# Patient Record
Sex: Female | Born: 1980
Health system: Southern US, Community
[De-identification: ages and names within clinical notes are randomized; demographics above are authoritative.]

## PROBLEM LIST (undated history)

## (undated) DIAGNOSIS — N390 Urinary tract infection, site not specified: Secondary | ICD-10-CM

## (undated) DIAGNOSIS — N6009 Solitary cyst of unspecified breast: Secondary | ICD-10-CM

## (undated) DIAGNOSIS — G43909 Migraine, unspecified, not intractable, without status migrainosus: Secondary | ICD-10-CM

## (undated) DIAGNOSIS — R51 Headache: Secondary | ICD-10-CM

## (undated) DIAGNOSIS — L700 Acne vulgaris: Secondary | ICD-10-CM

## (undated) DIAGNOSIS — G500 Trigeminal neuralgia: Secondary | ICD-10-CM

## (undated) HISTORY — DX: Trigeminal neuralgia: G50.0

## (undated) HISTORY — DX: Headache: R51

## (undated) HISTORY — DX: Acne vulgaris: L70.0

## (undated) HISTORY — DX: Migraine, unspecified, not intractable, without status migrainosus: G43.909

## (undated) HISTORY — PX: NO PAST SURGERIES: SHX2092

## (undated) HISTORY — DX: Urinary tract infection, site not specified: N39.0

## (undated) HISTORY — DX: Solitary cyst of unspecified breast: N60.09

---

## 2009-12-30 DIAGNOSIS — N6009 Solitary cyst of unspecified breast: Secondary | ICD-10-CM

## 2009-12-30 HISTORY — DX: Solitary cyst of unspecified breast: N60.09

## 2014-12-30 DIAGNOSIS — G8929 Other chronic pain: Secondary | ICD-10-CM

## 2014-12-30 DIAGNOSIS — L700 Acne vulgaris: Secondary | ICD-10-CM

## 2014-12-30 DIAGNOSIS — R519 Headache, unspecified: Secondary | ICD-10-CM

## 2014-12-30 HISTORY — DX: Acne vulgaris: L70.0

## 2014-12-30 HISTORY — DX: Headache, unspecified: R51.9

## 2014-12-30 HISTORY — DX: Other chronic pain: G89.29

## 2015-12-31 DIAGNOSIS — G500 Trigeminal neuralgia: Secondary | ICD-10-CM

## 2015-12-31 HISTORY — DX: Trigeminal neuralgia: G50.0

## 2016-07-12 ENCOUNTER — Other Ambulatory Visit (HOSPITAL_COMMUNITY): Payer: Self-pay | Admitting: *Deleted

## 2016-07-12 ENCOUNTER — Ambulatory Visit (INDEPENDENT_AMBULATORY_CARE_PROVIDER_SITE_OTHER): Payer: Self-pay | Admitting: Internal Medicine

## 2016-07-12 ENCOUNTER — Encounter: Payer: Self-pay | Admitting: Internal Medicine

## 2016-07-12 VITALS — BP 100/60 | HR 61 | Temp 98.2°F | Resp 16 | Ht 58.5 in | Wt 116.0 lb

## 2016-07-12 DIAGNOSIS — N6320 Unspecified lump in the left breast, unspecified quadrant: Secondary | ICD-10-CM

## 2016-07-12 DIAGNOSIS — A499 Bacterial infection, unspecified: Secondary | ICD-10-CM

## 2016-07-12 DIAGNOSIS — B9689 Other specified bacterial agents as the cause of diseases classified elsewhere: Secondary | ICD-10-CM

## 2016-07-12 DIAGNOSIS — N644 Mastodynia: Secondary | ICD-10-CM

## 2016-07-12 DIAGNOSIS — R300 Dysuria: Secondary | ICD-10-CM

## 2016-07-12 DIAGNOSIS — N760 Acute vaginitis: Secondary | ICD-10-CM

## 2016-07-12 LAB — POCT URINALYSIS DIPSTICK
BILIRUBIN UA: NEGATIVE
Glucose, UA: NEGATIVE
NITRITE UA: NEGATIVE
PH UA: 5
Protein, UA: NEGATIVE
Spec Grav, UA: 1.02
Urobilinogen, UA: 0.2

## 2016-07-12 LAB — POCT WET PREP WITH KOH
KOH PREP POC: NEGATIVE
Trichomonas, UA: NEGATIVE
YEAST WET PREP PER HPF POC: NEGATIVE

## 2016-07-12 MED ORDER — METRONIDAZOLE 500 MG PO TABS: ORAL_TABLET | ORAL | Status: DC

## 2016-07-12 NOTE — Progress Notes (Signed)
Subjective:    Patient ID: Tabitha [name], female    DOB: [birth year], 34 y.o.   MRN: [id number]  HPI   New patient here to establish  Concerns:  1.  Left Breast pain:  Had similar pain about 6 years ago. Was told she had a cyst then.  Stopped taking BCPs at the time and resolved.   Recurred about 2 months.  Pain is now constant since starting this go around.  Sore, throbbing pain, sometimes pinching or needle like pain.  No nipple discharge.  No axillary tenderness.  No redness or swelling of breast.    Does feel a little lump that is tender in the area of pain.  No hormonal birth control at this time.   Periods have been regular.   LMP June 19th--normal G1SAB1 Miscarried in first trimester. Taking Evening Primrose for the breast discomfort    2.  Vaginal burning and itching for 1 week.  Burning worse with urination--feels like it is also on the inside.  + urinary frequency.  Was taking OTC ? Azo for 4 days without improvement.  Last dose yesterday.  No fever, possibly some left flank pain.  No hematuria.  No nausea or vomiting.  Drinking fluids well. No antibiotic use recently.  Has this frequently, but always told her urine is ok.      Current outpatient prescriptions:  .  ibuprofen (ADVIL,MOTRIN) 200 MG tablet, Take 200 mg by mouth every 6 (six) hours as needed. 2 tabs every 6 hours as needed for breast pain, Disp: , Rfl:    No Known Allergies   Past Medical History  Diagnosis Date  . Breast cyst 2011    [place], [place]   No past surgical history on file.   No family history on file.   Social History   Social History  . Marital Status: Married    Spouse Name: Franciso Bend  . Number of Children: 0  . Years of Education: N/A   Occupational History  . unemployed    Social History Main Topics  . Smoking status: Former Smoker -- 0.25 packs/day for 7 years    Types: Cigarettes    Quit date: 12/30/2014  . Smokeless tobacco: Never Used     Comment: socially  .  Alcohol Use: 0.0 oz/week    0 Standard drinks or equivalent per week     Comment: socially  . Drug Use: No  . Sexual Activity:    Partners: Male    Birth Control/ Protection: Condom   Other Topics Concern  . Not on file   Social History Narrative   Originally from [place]   Came to [place] [employer] [place]. In 2010   Lived in [place] since 2.2017   Lives with her boyfriend       Review of Systems     Objective:   Physical Exam NAD HEENT:  PERRL, EOMI,  Neck:  Supple, No adenopathy Chest:  CTA Breasts:  No discreet focal mass, but generalized lumpiness in upper outer quadrant ob bilateral breasts, both areas of which are tender L>R.  No skin dimpling, nipple discharge or axillary adenopathy CV:  RRR with normal S1 and S2, No S3, S4 or murmur.  Radial pulses normal and equal Abd:  S, NT, No HSM or mass, + BS throughout. GU:  Normal external genitalia, mild white discharge, No vaginal or cervical inflammation. No CMT, No uterine or adnexal mass or tenderness.   Results for LOUNA, ROTHGEB (MRN [id number]) as of  07/17/2016 08:30  Ref. Range 07/12/2016 10:36  Bilirubin, UA Unknown negative  Clarity, UA Unknown clear  Color, UA Unknown yellow  Glucose Unknown negative  Ketones, UA Unknown 5(+/-)  Leukocytes, UA Latest Ref Range: Negative  small (1+) (A)  Nitrite, UA Unknown negative  pH, UA Unknown 5.0  Protein, UA Unknown negative  RBC, UA Unknown 1+  Specific Gravity, UA Unknown 1.020  Urobilinogen, UA Unknown 0.2      Assessment & Plan:  1.  Bilateral breast tenderness with fibrocystic feel to upper outer quads of breasts bilaterally.  Send for diagnostic mammogram and U/S  2.  Dysuria:  Unlikely UTI, but send for urine culture.  Not clear if any relationship, but does have bacterial vaginosis.  Treat the latter with 7 day course of twice daily Metronidazole 500 mg.  Addendum:  Delay in contacting patient regarding low count Urine culture E. Coli, nitrofurantoin and Macrobid too  expensive.  [facility]  To call if develops yeast infection symptoms (called to pt. By Jenean LindauEstefania)   3.  Vaginal Discharge/burning and itching:  BV treatment as above.  Check GC/Chlamydia as well.

## 2016-07-12 NOTE — Patient Instructions (Signed)
Ibuprofen 200 mg pastillas, 3-4 pastillas dos veces al dia para dolor de pecho

## 2016-07-15 LAB — URINE CULTURE

## 2016-07-18 LAB — GC/CHLAMYDIA PROBE AMP
CHLAMYDIA, DNA PROBE: NEGATIVE
Neisseria gonorrhoeae by PCR: NEGATIVE

## 2016-07-19 MED ORDER — AMOXICILLIN-POT CLAVULANATE 875-125 MG PO TABS: 1.0000 | ORAL_TABLET | Freq: Two times a day (BID) | ORAL | Status: DC

## 2016-08-01 ENCOUNTER — Ambulatory Visit (HOSPITAL_COMMUNITY)
Admission: RE | Admit: 2016-08-01 | Discharge: 2016-08-01 | Disposition: A | Discharge status: Home / Self Care | Payer: Self-pay | Source: Ambulatory Visit | Attending: Obstetrics and Gynecology | Admitting: Obstetrics and Gynecology

## 2016-08-01 ENCOUNTER — Ambulatory Visit
Admission: RE | Admit: 2016-08-01 | Discharge: 2016-08-01 | Disposition: A | Discharge status: Home / Self Care | Payer: No Typology Code available for payment source | Source: Ambulatory Visit | Attending: Obstetrics and Gynecology | Admitting: Obstetrics and Gynecology

## 2016-08-01 ENCOUNTER — Encounter (HOSPITAL_COMMUNITY): Payer: Self-pay

## 2016-08-01 ENCOUNTER — Other Ambulatory Visit (HOSPITAL_COMMUNITY): Payer: Self-pay | Admitting: Obstetrics and Gynecology

## 2016-08-01 VITALS — BP 100/60 | Temp 98.7°F | Ht 58.5 in | Wt 114.0 lb

## 2016-08-01 DIAGNOSIS — N6320 Unspecified lump in the left breast, unspecified quadrant: Secondary | ICD-10-CM

## 2016-08-01 DIAGNOSIS — N644 Mastodynia: Secondary | ICD-10-CM

## 2016-08-01 DIAGNOSIS — N6321 Unspecified lump in the left breast, upper outer quadrant: Secondary | ICD-10-CM

## 2016-08-01 DIAGNOSIS — Z1239 Encounter for other screening for malignant neoplasm of breast: Secondary | ICD-10-CM

## 2016-08-01 NOTE — Patient Instructions (Signed)
Explained self breast awareness to [facility]. Let patient know she did not need a Pap smear today due to last Pap smear was 1 1/2 years ago per patient. Let her know BCCCP will cover Pap smears every 3 years unless has a history of abnormal Pap smears. Referred patient to the [facility] for diagnostic mammogram. Appointment scheduled for Thursday, August 01, 2016 at 1440. Tabitha [name] verbalized understanding.  Malik Ruffino, [name], RN 3:29 PM

## 2016-08-01 NOTE — Progress Notes (Signed)
Complaints of left breast lump x 1 month that is painful. Patient states it is a constant pain that she rates at a 5 out of 10.  Pap Smear:  Pap smear not completed today. Last Pap smear was 1 1/2 years ago in [place] and normal per patient. Per patient has no history of an abnormal Pap smear. No Pap smear results are in EPIC.  Physical exam: Breasts Breasts symmetrical. No skin abnormalities bilateral breasts. No nipple retraction bilateral breasts. No nipple discharge bilateral breasts. No lymphadenopathy. No lumps palpated right breast. Palpated a mobile lump within the left breast at 2 o'clock next to the areola. Complaints of bilateral outer breast tenderness on exam. Referred patient to the [facility] for diagnostic mammogram. Appointment scheduled for Thursday, August 01, 2016 at 1440.        Pelvic/Bimanual No Pap smear completed today since last Pap smear was 1 1/2 years ago per patient. Pap smear not indicated per BCCCP guidelines.   Smoking History: Patient has never smoked.  Patient Navigation: Patient education provided. Access to services provided for patient through [facility] program. Spanish interpreter provided.    Used Spanish interpreter Darletta Moll from [employer].

## 2016-08-05 ENCOUNTER — Encounter (HOSPITAL_COMMUNITY): Payer: Self-pay | Admitting: *Deleted

## 2016-08-09 ENCOUNTER — Other Ambulatory Visit (INDEPENDENT_AMBULATORY_CARE_PROVIDER_SITE_OTHER): Payer: Self-pay | Admitting: Internal Medicine

## 2016-08-09 DIAGNOSIS — R300 Dysuria: Secondary | ICD-10-CM

## 2016-08-09 DIAGNOSIS — N390 Urinary tract infection, site not specified: Secondary | ICD-10-CM

## 2016-08-09 DIAGNOSIS — R319 Hematuria, unspecified: Secondary | ICD-10-CM

## 2016-08-09 LAB — POCT URINALYSIS DIPSTICK
BILIRUBIN UA: NEGATIVE
Glucose, UA: NEGATIVE
KETONES UA: NEGATIVE
Nitrite, UA: POSITIVE
PH UA: 6
Protein, UA: NEGATIVE
SPEC GRAV UA: 1.015
Urobilinogen, UA: 0.2

## 2016-08-09 MED ORDER — NITROFURANTOIN MONOHYD MACRO 100 MG PO CAPS: 100.0000 mg | ORAL_CAPSULE | Freq: Two times a day (BID) | ORAL | 0 refills | Status: AC

## 2016-08-09 NOTE — Progress Notes (Signed)
Patient came in today complaining of dysuria. No physician in the office today. UA positive for nitrites. Sent for culture. Patient informed that we will call her when the results are back from the culture. Macrobid sent in for 10 days.

## 2016-08-09 NOTE — Patient Instructions (Signed)
Urocultivo y antibiograma (Urine Culture and Sensitivity Testing) POR QU ME DEBO REALIZAR ESTE ANLISIS?  El urocultivo es un anlisis que permite determinar si hay proliferacin de grmenes en la [place]. Normalmente, no hay grmenes en la orina (estril). Por lo general, los grmenes presentes en la orina son bacterias. A veces, pueden ser hongos. Estos grmenes pueden causar una infeccin urinaria. Pueden hacerle este anlisis si tiene sntomas de infeccin urinaria. Estos pueden incluir los siguientes:  Ganas frecuentes de Geographical information systems officer.  Ardor al Beatrix Shipper. Si est embarazada, el mdico puede indicar que se haga este anlisis para evaluar si tiene una infeccin urinaria. Cuando es [place], la orina pasa por el conducto a travs del cual se vaca la vejiga (uretra). En los hombres, la orina se evaca a travs de un orificio en la punta del pene. En las mujeres, la eliminacin se produce a travs de un conducto que est justo arriba de la abertura vaginal. Cerca de estas zonas, puede haber bacterias que normalmente viven en la piel (flora normal). QU TIPO DE MUESTRA SE TOMA? Para realizar un urocultivo, se debe recolectar una [place] de orina de modo de evitar que la flora normal llegue a la [place]. El mtodo que se [place] con ms frecuencia se conoce como recoleccin de una muestra estril. En algunos casos, tal vez haya que recolectar la orina directamente de la vejiga por medio de una sonda delgada y flexible (catter). El mdico introduce el catter a travs de Network engineer a la vejiga. La muestra de orina se Scientific laboratory technician en placas que contienen una sustancia que estimula la proliferacin de las bacterias (placas de agar). Estas placas se conservan a temperatura ambiente durante un lapso de 24 a 48horas para comprobar si hay crecimiento de bacterias u otros grmenes. Luego, un tcnico de laboratorio las examina con un microscopio para Landscape architect presencia de grmenes. Los grmenes que  crezcan en el cultivo sern sometidos a pruebas frente a una variedad de medicamentos para determinar cul es el ms eficaz (antibiograma). En el caso de una infeccin urinaria causada por bacterias, se realizarn pruebas con varios tipos de antibiticos. CMO DEBO PREPARARME PARA ESTE ANLISIS?  No debe orinar durante una hora antes de la recoleccin de la [place].  Tome un vaso de agua aproximadamente antes de la recoleccin de la [place].  Informe al mdico si ha estado tomando antibiticos, ya que estos pueden incidir en los [place] del [place]. El mdico puede proporcionarle paos estriles para limpiar la vagina o el pene a fin de prepararse para recolectar una muestra estril. Para tomar la [place], tendr que hacer lo siguiente: Mujeres y nias  Sintese en el inodoro y separe los labios de la vagina.  Con un pao, limpie la zona de la vagina de Camera operator.  Con un segundo pao, limpie el orificio de Engineer, mining.  Evacue una pequea cantidad de orina directamente en el inodoro mientras mantiene separados los labios de la vagina.  Luego, sostenga un recipiente estril por debajo y orine dentro de [place].  Llene el recipiente hasta aproximadamente la mitad de su capacidad. Tpelo y devulvalo para su anlisis. Hombres y nios  Con un pao estril, limpie la punta del pene.  Evacue primero una pequea cantidad de orina directamente en el inodoro.  Luego, orine dentro del recipiente estril.  Llene el recipiente hasta aproximadamente la mitad de su capacidad. Tpelo y devulvalo para su anlisis. QU SIGNIFICAN LOS RESULTADOS? Los resultados del cultivo de [place]  y el antibiograma sern positivos o negativos.   Si crecen bastantes bacterias en la [place]muestra de [place], se considera que el resultado del anlisis es positivo.  Si crecen muchas bacterias diferentes en la [place]muestra de [place], tal vez el informe indique que el anlisis est contaminado.  Si no  crecen bacterias en la muestra despus de 24 a 48horas, se considera que el resultado del anlisis es negativo.  Los Norfolk Southernresultados del antibiograma le permiten al mdico saber qu medicamentos usar para tratar la infeccin. Si los resultados del cultivo de [place]or[place] son Ravennanegativos, significa lo siguiente:  Que es menos probable que tenga una infeccin urinaria.  Que se puede repetir el anlisis si los sntomas continan. Si los resultados del cultivo de [place]orina son positivos, significa lo siguiente:  Que es ms probable que tenga una infeccin urinaria.  Que tal vez deba comenzar un tratamiento en funcin de los [place]resultados del antibiograma. Hable con el mdico Dole Foodsobre los resultados, las opciones de tratamiento y, si es necesario, la necesidad de Education officer, environmentalrealizar ms [place]estudios. Es su responsabilidad retirar el resultado del [place]estudio. Consulte en el laboratorio o en el departamento en el que fue realizado el estudio cundo y cmo podr Starbucks Corporationobtener los resultados. Hable con el mdico si tiene Dynegyalguna pregunta sobre los resultados.   Esta informacin no tiene Theme park managercomo fin reemplazar el consejo del mdico. Asegrese de hacerle al mdico cualquier pregunta que tenga.   Document Released: 10/06/2013 Document Revised: 01/06/2015 Elsevier Interactive Patient Education [employer]2016 Elsevier Inc.

## 2016-08-12 LAB — URINE CULTURE

## 2016-08-12 NOTE — Progress Notes (Signed)
Pt. Informed and scheduled to come in for repeat UA in 10 days

## 2016-08-23 ENCOUNTER — Other Ambulatory Visit (INDEPENDENT_AMBULATORY_CARE_PROVIDER_SITE_OTHER): Payer: Self-pay

## 2016-08-23 ENCOUNTER — Ambulatory Visit (INDEPENDENT_AMBULATORY_CARE_PROVIDER_SITE_OTHER): Payer: Self-pay | Admitting: Internal Medicine

## 2016-08-23 ENCOUNTER — Encounter: Payer: Self-pay | Admitting: Internal Medicine

## 2016-08-23 VITALS — BP 98/60 | HR 66 | Temp 98.2°F | Resp 16 | Ht <= 58 in | Wt 108.0 lb

## 2016-08-23 DIAGNOSIS — R300 Dysuria: Secondary | ICD-10-CM

## 2016-08-23 DIAGNOSIS — K0889 Other specified disorders of teeth and supporting structures: Secondary | ICD-10-CM

## 2016-08-23 LAB — POCT URINALYSIS DIPSTICK
BILIRUBIN UA: NEGATIVE
Glucose, UA: NEGATIVE
Leukocytes, UA: NEGATIVE
Nitrite, UA: NEGATIVE
PH UA: 7
PROTEIN UA: NEGATIVE
Spec Grav, UA: 1.015
Urobilinogen, UA: 0.2

## 2016-08-23 MED ORDER — NAPROXEN 500 MG PO TABS: 500.0000 mg | ORAL_TABLET | Freq: Two times a day (BID) | ORAL | 1 refills | Status: DC

## 2016-08-23 MED ORDER — HYDROCODONE-ACETAMINOPHEN 5-325 MG PO TABS: ORAL_TABLET | ORAL | 0 refills | Status: DC

## 2016-08-23 NOTE — Progress Notes (Signed)
   Subjective:    Patient ID: Tabitha [name], female    DOB: 05/23/1981, 35 y.o.   MRN: [id number]  HPI   1.  Dental pain:  Has had pain in right lower molar for a few days. Pain with eating or with air on her tooth--electric type pain.  No inflammation/swelling or redness of gum.   No fever.   Brushes twice daily and flosses occasionally. Tried 600 mg of Ibuprofen and was not effective.  2.  UTI:  Grew low count e coli sensitive to Augmentin in July.  Treated with Augmentin.  Did not improve and recultured with return of definitive growth of e coli now resistant to Augmentin.  Treated with 10 days of Macrobid.  States a bit better, but still with dysuria every time she urinates. UA shows mild microscopic blood, but leukocytes have cleared this time. No intercouse since started with symptoms  Current Meds  Medication Sig  . [DISCONTINUED] ibuprofen (ADVIL,MOTRIN) 200 MG tablet Take 200 mg by mouth every 6 (six) hours as needed. 2 tabs every 6 hours as needed for breast pain   No Known Allergies    Review of Systems     Objective:   Physical Exam NAD HEENT:  Darkened area of posterior molar, right lower jaw.  No surrounding erythema or swelling.  "electric shocks"  When tap tooth.   Neck:  No adenopathy, supple Chest:  CTA CV:  RRR without murmur or rub No flank tenderness        Assessment & Plan:  1.  Dental Pain:  Try Naproxen twice daily with food for much of pain control and Hydrocodone when more severe.  Dental referral--urgent as not eating.  2.  Dysuria with microscopic hematuria.  Symptoms continue.  Recheck urine culture.  May need to come in for repeated Ceftriaxone IM dosing if is resistant to [facility]

## 2016-08-26 LAB — URINE CULTURE

## 2016-08-27 ENCOUNTER — Other Ambulatory Visit (INDEPENDENT_AMBULATORY_CARE_PROVIDER_SITE_OTHER): Payer: Self-pay

## 2016-08-27 DIAGNOSIS — N3000 Acute cystitis without hematuria: Secondary | ICD-10-CM

## 2016-08-27 MED ORDER — CEFTRIAXONE SODIUM 1 G IJ SOLR
1.0000 g | Freq: Once | INTRAMUSCULAR | Status: AC
  Administered 2016-08-27: 1 g via INTRAMUSCULAR

## 2016-08-27 NOTE — Progress Notes (Signed)
Patient came in for first 1 gram injection of Rocephin. Patient given 1ml of ceftriaxone in left glute. Patient tolerated well. LOT [id number] EXP 02/27/2018 NDC [id number] Diluted with lidocaine 1% LOT [id number] EXP 02/27/2017 NDC [id number]  Patient spoke with Dr. Delrae AlfredMulberry and stated [facility] but doesn't [facility] the clinic. Patient to come back for another injection tomorrow.

## 2016-08-28 ENCOUNTER — Other Ambulatory Visit (INDEPENDENT_AMBULATORY_CARE_PROVIDER_SITE_OTHER): Payer: Self-pay

## 2016-08-28 DIAGNOSIS — N3000 Acute cystitis without hematuria: Secondary | ICD-10-CM

## 2016-08-28 MED ORDER — CEFTRIAXONE SODIUM 1 G IJ SOLR
1.0000 g | INTRAMUSCULAR | Status: DC
  Administered 2016-08-28 – 2016-08-30 (×3): 1 g via INTRAMUSCULAR

## 2016-08-28 NOTE — Progress Notes (Signed)
Patient given 2nd dose of Rocephin today IM in right glute. Patient tolerated well.

## 2016-08-29 ENCOUNTER — Ambulatory Visit (INDEPENDENT_AMBULATORY_CARE_PROVIDER_SITE_OTHER): Payer: Self-pay

## 2016-08-29 DIAGNOSIS — N3000 Acute cystitis without hematuria: Secondary | ICD-10-CM

## 2016-08-29 NOTE — Progress Notes (Signed)
Patient came in for 3rd injection of rocephin for UTI in left glute. Patient tolerated well. Diluted with 2.601ml Lidocaine Lot [id number] exp 02/27/2017. Patient to receive 4th injection tomorrow and repeat UA.

## 2016-08-30 ENCOUNTER — Ambulatory Visit (INDEPENDENT_AMBULATORY_CARE_PROVIDER_SITE_OTHER): Payer: Self-pay

## 2016-08-30 DIAGNOSIS — N3000 Acute cystitis without hematuria: Secondary | ICD-10-CM

## 2016-08-30 LAB — POCT URINALYSIS DIPSTICK
Bilirubin, UA: NEGATIVE
GLUCOSE UA: NEGATIVE
KETONES UA: NEGATIVE
Nitrite, UA: NEGATIVE
RBC UA: NEGATIVE
SPEC GRAV UA: 1.02
UROBILINOGEN UA: 0.2
pH, UA: 6

## 2016-08-30 NOTE — Progress Notes (Signed)
Patient given 1 g Rocephin in right glute. Patient will return tomorrow for a 5th injection. UA and urine cx sent to [facility] today. Patient tolerated injection well.

## 2016-08-31 ENCOUNTER — Ambulatory Visit (INDEPENDENT_AMBULATORY_CARE_PROVIDER_SITE_OTHER): Payer: Self-pay | Admitting: Internal Medicine

## 2016-08-31 DIAGNOSIS — N3000 Acute cystitis without hematuria: Secondary | ICD-10-CM

## 2016-08-31 LAB — URINALYSIS, COMPLETE
BILIRUBIN UA: NEGATIVE
GLUCOSE, UA: NEGATIVE
KETONES UA: NEGATIVE
Leukocytes, UA: NEGATIVE
NITRITE UA: NEGATIVE
RBC UA: NEGATIVE
Specific Gravity, UA: 1.012 (ref 1.005–1.030)
Urobilinogen, Ur: 0.2 mg/dL (ref 0.2–1.0)
pH, UA: 6.5 (ref 5.0–7.5)

## 2016-08-31 LAB — MICROSCOPIC EXAMINATION
Bacteria, UA: NONE SEEN
CASTS: NONE SEEN /LPF

## 2016-09-01 ENCOUNTER — Ambulatory Visit (INDEPENDENT_AMBULATORY_CARE_PROVIDER_SITE_OTHER): Payer: Self-pay | Admitting: Internal Medicine

## 2016-09-01 DIAGNOSIS — N3000 Acute cystitis without hematuria: Secondary | ICD-10-CM

## 2016-09-01 LAB — URINE CULTURE: ORGANISM ID, BACTERIA: NO GROWTH

## 2016-09-03 ENCOUNTER — Encounter: Payer: Self-pay | Admitting: Internal Medicine

## 2016-09-03 MED ORDER — CEFTRIAXONE SODIUM 1 G IJ SOLR
1.0000 g | INTRAMUSCULAR | Status: DC
  Administered 2016-09-01 – 2016-09-03 (×2): 1 g via INTRAMUSCULAR

## 2016-09-03 NOTE — Progress Notes (Signed)
Here for follow up of UTI E. Coli sensitive to Ceftriaxone.  Follow up urine culture negative for growth and UA was unremarkable from 2 days ago. Pt. Feeling much better. Last dose of Ceftriaxone today.   To call if problems.

## 2016-09-03 NOTE — Progress Notes (Signed)
Here for 5th dose of Ceftriaxone 1 g IM.  Tolerating medication fine.  Discussed her microscopic UA was normal. And am awaiting her culture results from yesterday. She states she is feeling much better.

## 2016-09-26 ENCOUNTER — Telehealth: Payer: Self-pay | Admitting: Internal Medicine

## 2016-09-26 NOTE — Telephone Encounter (Signed)
Patient called stating she has been taking amitriptyline 25 mg tablet to help her sleep and does not have any refills from her previous doctor. Patient would like Dr. Delrae AlfredMulberry to call in refills. Also patient states she was seen at the dentist's office and the pain medication they prescribed her is not helping with the pain. Patient requesting something stronger for pain to be called in by Dr. Delrae AlfredMulberry

## 2016-09-26 NOTE — Telephone Encounter (Signed)
Amitriptyline is not a medicine I would prescribe without discussing with her first.  I was unaware she was taking it. She would have to make an appointment to discuss her sleep issues first.   She will need to ask the dental clinic for her pain medication as they are aware of what her pain control needs should be.

## 2016-10-01 NOTE — Telephone Encounter (Signed)
VM not set up.

## 2016-10-02 NOTE — Telephone Encounter (Signed)
On 10/02/2016 Patient came to office to inquire about Rx amitriptyline 25 mg tablet.  Pat relayed message left by Dr. Delrae AlfredMulberry.  Patient's representative said, he would wait and talk with Estefania about the Rx.  Pat informed him that we could not go against Dr. Renne CriglerMulberry's order.   Patient would need to make an appointment to discuss her sleep issues first.  Patient was not satisfied with response Dennie BiblePat gave her.  Patient will come back after 2:00 p.m. On 10/02/2016.

## 2016-10-02 NOTE — Telephone Encounter (Signed)
Patient scheduled to come in for OV to discuss sleep issues 10/09/16 @ 6:00PM

## 2016-10-09 ENCOUNTER — Encounter: Payer: Self-pay | Admitting: Internal Medicine

## 2016-10-09 ENCOUNTER — Ambulatory Visit (INDEPENDENT_AMBULATORY_CARE_PROVIDER_SITE_OTHER): Payer: Self-pay | Admitting: Internal Medicine

## 2016-10-09 DIAGNOSIS — L709 Acne, unspecified: Secondary | ICD-10-CM

## 2016-10-09 DIAGNOSIS — G4700 Insomnia, unspecified: Secondary | ICD-10-CM | POA: Insufficient documentation

## 2016-10-09 MED ORDER — AMITRIPTYLINE HCL 25 MG PO TABS: 25.0000 mg | ORAL_TABLET | Freq: Every evening | ORAL | 11 refills | Status: DC | PRN

## 2016-10-09 NOTE — Progress Notes (Signed)
   Subjective:    Patient ID: Tabitha [name], female    DOB: 07/28/1981, 35 y.o.   MRN: [id number]  HPI   1.  Insomnia:  Pt. Previously took Amitriptyline for 9 years to sleep.  Has been out of medication for 4 days.   Pt. Seems irritated today.  States this is the only medication that has worked for her for sleep. Discussed how important it is to share her medications fully in the future as they can have interactions with newer medications and possibly cause her significant problems Discussed Amitriptyline can cause urinary retention and am concerned with her recurrent UTIs that are difficult to treat.  She states she had the UTIs years before starting the Amitritypline.  This is the only medication she has found that helps, but is unable Not able to initiate sleep without the medication.  Wakes also 5-6 times per night, develops neck pain and nausea if tries to wean.  Does not want to stop. Cannot remember what the names of what other meds she has taken in the past.  2.  Acne:  Minor acneiform lesions in nasolabial area.  Uses on limited basis at bedtime to acne lesions.  Not clear where she was getting this as well.  Current Meds  Medication Sig  . amitriptyline (ELAVIL) 25 MG tablet Take 1 tablet (25 mg total) by mouth at bedtime as needed for sleep.  Marland Kitchen. tretinoin (RETIN-A) 0.05 % cream Apply topically at bedtime.  . [DISCONTINUED] amitriptyline (ELAVIL) 25 MG tablet Take 25 mg by mouth at bedtime as needed for sleep.   No Known Allergies    Review of Systems     Objective:   Physical Exam  Patient irritated with discussion today.  4-5 tiny acneiform lesions at nasolabial fold--small red bumps.      Assessment & Plan:  1.  Insomnia:  Have filled the Amitriptyline 25 mg   2.  Acne, mild:  To call when she needs a refill of her Retin A.  Long discussion to stop before she ever considers getting pregnant.  To use sunscreen when using.

## 2016-11-11 ENCOUNTER — Ambulatory Visit: Payer: No Typology Code available for payment source | Admitting: Neurology

## 2016-11-13 ENCOUNTER — Ambulatory Visit: Payer: No Typology Code available for payment source | Admitting: Diagnostic Neuroimaging

## 2017-01-08 ENCOUNTER — Ambulatory Visit: Payer: No Typology Code available for payment source | Admitting: Diagnostic Neuroimaging

## 2017-01-09 ENCOUNTER — Encounter: Payer: Self-pay | Admitting: Diagnostic Neuroimaging

## 2017-01-10 ENCOUNTER — Encounter: Payer: Self-pay | Admitting: Neurology

## 2017-01-10 ENCOUNTER — Ambulatory Visit (INDEPENDENT_AMBULATORY_CARE_PROVIDER_SITE_OTHER): Payer: Self-pay | Admitting: Neurology

## 2017-01-10 DIAGNOSIS — G500 Trigeminal neuralgia: Secondary | ICD-10-CM | POA: Insufficient documentation

## 2017-01-10 HISTORY — DX: Trigeminal neuralgia: G50.0

## 2017-01-10 MED ORDER — CARBAMAZEPINE 200 MG PO TABS: 200.0000 mg | ORAL_TABLET | Freq: Three times a day (TID) | ORAL | 3 refills | Status: DC

## 2017-01-10 NOTE — Patient Instructions (Addendum)
Neuralgia del trigmino (Trigeminal Neuralgia) La neuralgia del trigmino es un trastorno nervioso que causa crisis de dolor facial intenso. Las crisis pueden durar unos segundos o varios minutos. Pueden ocurrir [address] Drivedurante das, [address] Circlesemanas o Happymeses, y Clinical biochemistluego desaparecer durante meses o aos. A la neuralgia del trigmino tambin se la conoce como tic doloroso. CAUSAS La causa de esta afeccin es el dao a un nervio del rostro llamado trigmino. Las siguientes acciones pueden desencadenar una crisis:  Hablar.  Masticar.  Maquillarse.  Lavarse el rostro.  Afeitarse el rostro.  Cepillarse los dientes.  Tocarse el rostro. FACTORES DE RIESGO Es ms probable que esta afeccin se manifieste en:  Las mujeres.  Las Smith Internationalpersonas mayores de [id number]. SNTOMAS El sntoma principal de esta afeccin es el dolor en la [place] Harborcreekmandbula, los labios, los ojos, la [place] Clubnariz, el cuero cabelludo, la frente y Recruitment consultantel rostro. El dolor puede ser intenso, punzante o similar a un choque elctrico. DIAGNSTICO Esta afeccin se diagnostica mediante un examen fsico. Se puede realizar una tomografa computarizada (TC) o una resonancia magntica (RM) para Engineer, productiondescartar la presencia de otros trastornos que pueden causar Armed forces technical officerdolor facial. TRATAMIENTO El tratamiento de esta afeccin puede incluir lo siguiente:  Automotive engineervitar las cosas que desencadenan las crisis.  Analgsicos.  Ciruga. Esta puede realizarse Circuit Citycuando los casos son graves, si otro tratamiento mdico no brinda alivio. INSTRUCCIONES PARA EL CUIDADO EN EL HOGAR  Tome los medicamentos de venta libre y los recetados solamente como se lo haya indicado el mdico.  Si desea quedar embarazada, hable con el mdico antes de intentarlo.  Evite los factores desencadenantes de las crisis. Puede ser de ayuda:  Masticar del lado de la boca que no est afectado.  Evitar tocarse el rostro.  Evitar las rfagas de aire fro o caliente. SOLICITE ATENCIN MDICA SI:  El medicamento no Hotel managerle calma el  dolor.  Le aparecen sntomas nuevos que no se pueden explicar, por ejemplo:  Visin doble.  Debilidad facial.  Cambios en la audicin o el equilibrio.  Ardelle AntonQueda embarazada. SOLICITE ATENCIN MDICA DE INMEDIATO SI:  El dolor es insoportable, y el analgsico no lo [place]alivia. Esta informacin no tiene Theme park managercomo fin reemplazar el consejo del mdico. Asegrese de hacerle al mdico cualquier pregunta que tenga. Document Released: 09/25/2005 Document Revised: 09/06/2015 Document Reviewed: 04/10/2015 Elsevier Interactive Patient Education  2017 [employer]vin[employer]vier Inc.

## 2017-01-10 NOTE — Progress Notes (Signed)
Reason for visit: Trigeminal neuralgia  Referring physician: Dr. Jama Flavors Tabitha [name] is a 36 y.o. female  History of present illness:  Tabitha [name] is a 36 year old right-handed Hispanic female who is non-English-speaking. The patient comes in today with an interpreter. The patient developed right-sided lower jaw discomfort that began about 5 months ago. Initially she went to her dentist, but eventually she followed up with Dr. Purnell Shoemaker, and she was referred to this office. The patient describes an electric shock sensation that goes into her lower face, this can be exacerbated by talking or chewing, or opening her mouth wide. Touching the right face may bring on pain as well. The patient denies any overt numbness of the face, she denies numbness or weakness of the extremities. She denies any neck pain or visual complaints. The patient has not had any balance changes or difficulty controlling the bowels or the bladder. The patient recently was placed on carbamazepine 3 weeks ago taking 100 mg twice daily. The patient does get some pain benefit from this, but she does have breakthrough pain and occasionally she will double up on the medication. She comes in today for an evaluation.  Past Medical History:  Diagnosis Date  . Breast cyst 2011   [place], [place]  . Chronic headaches 2016   Listed as tension and migranous at different times in old records.  Treated with Amitriptyline  . Cystic acne 2016   Treated with Retin A 0.05% cream in past  . Migraine   . Recurrent UTI 2012 to present   Often difficult to resolve  . Trigeminal neuralgia of right side of face 01/10/2017   Right V3 distribution    Past Surgical History:  Procedure Laterality Date  . NO PAST SURGERIES      History reviewed. No pertinent family history.  Social history:  reports that she has been smoking Cigarettes.  She has a 1.75 pack-year smoking history. She has never used smokeless tobacco. She reports that  she drinks alcohol. She reports that she does not use drugs.  Medications:  Prior to Admission medications   Medication Sig Start Date End Date Taking? Authorizing Provider  amitriptyline (ELAVIL) 25 MG tablet Take 1 tablet (25 mg total) by mouth at bedtime as needed for sleep. 10/09/16  Yes Julieanne Manson, MD  tretinoin (RETIN-A) 0.05 % cream Apply topically at bedtime.   Yes Historical Provider, MD  carbamazepine (TEGRETOL) 200 MG tablet Take 1 tablet (200 mg total) by mouth 3 (three) times daily. 01/10/17   York Spaniel, MD     No Known Allergies  ROS:  Out of a complete 14 system review of symptoms, the patient complains only of the following symptoms, and all other reviewed systems are negative.  Fevers, chills, weight gain Feeling cold, increased thirst Muscle cramps Dizziness Sleepiness  Blood pressure (!) 99/53, pulse 73, height 4\' 10"  (1.473 m), weight 117 lb 8 oz (53.3 kg).  Physical Exam  General: The patient is alert and cooperative at the time of the examination.  Eyes: Pupils are equal, round, and reactive to light. Discs are flat bilaterally.  Neck: The neck is supple, no carotid bruits are noted.  Respiratory: The respiratory examination is clear.  Cardiovascular: The cardiovascular examination reveals a regular rate and rhythm, no obvious murmurs or rubs are noted.  Skin: Extremities are without significant edema.  Neurologic Exam  Mental status: The patient is alert and oriented x 3 at the time of the examination.  The patient has apparent normal recent and remote memory, with an apparently normal attention span and concentration ability.  Cranial nerves: Facial symmetry is present. There is good sensation of the face to pinprick and soft touch bilaterally. The strength of the facial muscles and the muscles to head turning and shoulder shrug are normal bilaterally. Speech is well enunciated, no aphasia or dysarthria is noted. Extraocular movements are  full. Visual fields are full. The tongue is midline, and the patient has symmetric elevation of the soft palate. No obvious hearing deficits are noted.  Motor: The motor testing reveals 5 over 5 strength of all 4 extremities. Good symmetric motor tone is noted throughout.  Sensory: Sensory testing is intact to pinprick, soft touch and vibration sensation on all 4 extremities. No evidence of extinction is noted.  Coordination: Cerebellar testing reveals good finger-nose-finger and heel-to-shin bilaterally.  Gait and station: Gait is normal. Tandem gait is normal. Romberg is negative. No drift is seen.  Reflexes: Deep tendon reflexes are symmetric and normal bilaterally. Toes are downgoing bilaterally.   Assessment/Plan:  1. Right V3 distribution trigeminal neuralgia  The patient has had pain for about 5 months, she just recently was placed on carbamazepine, but the dose will need to be increased. She will go to 200 mg twice daily, a prescription was called in. If the pain persists, she is to contact our office. She will follow-up in 6 weeks, blood work will be done at that time.  Marlan Palau. Keith Carden Teel MD 01/10/2017 12:22 PM  [facility] [address] [place], [place] [address]  Phone [phone] Fax [phone]

## 2017-01-31 ENCOUNTER — Ambulatory Visit: Payer: No Typology Code available for payment source | Admitting: Diagnostic Neuroimaging

## 2017-04-10 ENCOUNTER — Ambulatory Visit: Payer: Self-pay | Admitting: Internal Medicine

## 2017-04-17 ENCOUNTER — Other Ambulatory Visit: Payer: Self-pay | Admitting: Neurology

## 2017-04-17 MED ORDER — CARBAMAZEPINE 200 MG PO TABS: 200.0000 mg | ORAL_TABLET | Freq: Two times a day (BID) | ORAL | 3 refills | Status: DC

## 2017-04-17 NOTE — Addendum Note (Signed)
Addended by: York Spaniel on: 04/17/2017 01:09 PM   Modules accepted: Orders

## 2017-04-17 NOTE — Telephone Encounter (Signed)
I called in a prescription for carbamazepine 200 mg twice daily. The patient will need a revisit at some point.

## 2017-04-17 NOTE — Telephone Encounter (Signed)
Called pt. Advised rx refill sent but she needs f/u scheduled with NP per CW,MD. Scheduled f/u for 05/20/17 at 845am. Pt verbalized understanding and read back date and time correctly.

## 2017-04-17 NOTE — Telephone Encounter (Signed)
Pt called for refill of carbamazepine (TEGRETOL) 200 MG tablet she would like that called into   [facility] [address] ([address]),  - [address] [phone] (Phone) [phone] (Fax)

## 2017-05-20 ENCOUNTER — Encounter: Payer: Self-pay | Admitting: Nurse Practitioner

## 2017-05-20 ENCOUNTER — Ambulatory Visit (INDEPENDENT_AMBULATORY_CARE_PROVIDER_SITE_OTHER): Payer: Self-pay | Admitting: Nurse Practitioner

## 2017-05-20 VITALS — BP 97/67 | HR 74 | Ht <= 58 in | Wt 117.7 lb

## 2017-05-20 DIAGNOSIS — G500 Trigeminal neuralgia: Secondary | ICD-10-CM

## 2017-05-20 DIAGNOSIS — Z5181 Encounter for therapeutic drug level monitoring: Secondary | ICD-10-CM | POA: Insufficient documentation

## 2017-05-20 NOTE — Patient Instructions (Addendum)
Continue Tegretol for facial pain twice daily Will check labs today Follow-up in 6 months  Trigeminal Neuralgia Trigeminal neuralgia is a nerve disorder that causes attacks of severe facial pain. The attacks last from a few seconds to several minutes. They can happen for days, weeks, or months and then go away for months or years. Trigeminal neuralgia is also called tic douloureux. What are the causes? This condition is caused by damage to a nerve in the face that is called the trigeminal nerve. An attack can be triggered by:  Talking.  Chewing.  Putting on makeup.  Washing your face.  Shaving your face.  Brushing your teeth.  Touching your face. What increases the risk? This condition is more likely to develop in:  Women.  People who are 36 years of age or older. What are the signs or symptoms? The main symptom of this condition is pain in the jaw, lips, eyes, nose, scalp, forehead, and face. The pain may be intense, stabbing, electric, or shock-like. How is this diagnosed? This condition is diagnosed with a physical exam. A CT scan or MRI may be done to rule out other conditions that can cause facial pain. How is this treated? This condition may be treated with:  Avoiding the things that trigger your attacks.  Pain medicine.  Surgery. This may be done in severe cases if other medical treatment does not provide relief. Follow these instructions at home:  Take over-the-counter and prescription medicines only as told by your health care provider.  If you wish to get pregnant, talk with your health care provider before you start trying to get pregnant.  Avoid the things that trigger your attacks. It may help to:  Chew on the unaffected side of your mouth.  Avoid touching your face.  Avoid blasts of hot or cold air. Contact a health care provider if:  Your pain medicine is not helping.  You develop new, unexplained symptoms, such as:  Double vision.  Facial  weakness.  Changes in hearing or balance.  You become pregnant. Get help right away if:  Your pain is unbearable, and your pain medicine does not help. This information is not intended to replace advice given to you by your health care provider. Make sure you discuss any questions you have with your health care provider. Document Released: 12/13/2000 Document Revised: 08/18/2016 Document Reviewed: 04/10/2015 Elsevier Interactive Patient Education  2017 [employer]

## 2017-05-20 NOTE — Progress Notes (Signed)
[facility]  PATIENT: Tabitha [name] DOB: Mar 19, 1981   REASON FOR VISIT: Follow-up for trigeminal neuralgia HISTORY FROM: Interpreter    HISTORY OF PRESENT ILLNESS: UPDATE 11/20/2017 CM Tabitha [name], 36 year old female returns for follow-up with her interpreter she is not English-speaking. She has a history of right sided trigeminal neuralgia and was placed on Tegretol which was increased at her last visit with Dr. Anne Hahn. She stopped the medication for several weeks and her pain returned and she says it took her at least a week to get back under control. She is now taking Tegretol twice a day. Her symptoms seem to be fairly well controlled at present. She returns for reevaluation. She needs lab work   HISTORY 01/10/17 KWMs. Tabitha [name] is a 36 year old right-handed Hispanic female who is non-English-speaking. The patient comes in today with an interpreter. The patient developed right-sided lower jaw discomfort that began about 5 months ago. Initially she went to her dentist, but eventually she followed up with Dr. Purnell Shoemaker, and she was referred to this office. The patient describes an electric shock sensation that goes into her lower face, this can be exacerbated by talking or chewing, or opening her mouth wide. Touching the right face may bring on pain as well. The patient denies any overt numbness of the face, she denies numbness or weakness of the extremities. She denies any neck pain or visual complaints. The patient has not had any balance changes or difficulty controlling the bowels or the bladder. The patient recently was placed on carbamazepine 3 weeks ago taking 100 mg twice daily. The patient does get some pain benefit from this, but she does have breakthrough pain and occasionally she will double up on the medication. She comes in today for an evaluation.   REVIEW OF SYSTEMS: Full 14 system review of systems performed and notable only for those listed, all others are  neg:  Constitutional: neg  Cardiovascular: neg Ear/Nose/Throat: neg  Skin: neg Eyes: neg Respiratory: neg Gastroitestinal: neg  Hematology/Lymphatic: neg  Endocrine: neg Musculoskeletal:neg Allergy/Immunology: neg Neurological: Facial pain Psychiatric: neg Sleep : neg   ALLERGIES: No Known Allergies  HOME MEDICATIONS: Outpatient Medications Prior to Visit  Medication Sig Dispense Refill  . amitriptyline (ELAVIL) 25 MG tablet Take 1 tablet (25 mg total) by mouth at bedtime as needed for sleep. 30 tablet 11  . carbamazepine (TEGRETOL) 200 MG tablet Take 1 tablet (200 mg total) by mouth 2 (two) times daily. 60 tablet 3  . tretinoin (RETIN-A) 0.05 % cream Apply topically at bedtime.     No facility-administered medications prior to visit.     PAST MEDICAL HISTORY: Past Medical History:  Diagnosis Date  . Breast cyst 2011   [place], [place]  . Chronic headaches 2016   Listed as tension and migranous at different times in old records.  Treated with Amitriptyline  . Cystic acne 2016   Treated with Retin A 0.05% cream in past  . Migraine   . Recurrent UTI 2012 to present   Often difficult to resolve  . Trigeminal neuralgia of right side of face 01/10/2017   Right V3 distribution    PAST SURGICAL HISTORY: Past Surgical History:  Procedure Laterality Date  . NO PAST SURGERIES      FAMILY HISTORY: History reviewed. No pertinent family history.  SOCIAL HISTORY: Social History   Social History  . Marital status: Married    Spouse name: Franciso Bend  . Number of children: 0  . Years of  education: Primary   Occupational History  . unemployed    Social History Main Topics  . Smoking status: Former Smoker    Packs/day: 0.25    Years: 7.00    Types: Cigarettes  . Smokeless tobacco: Never Used     Comment: socially  . Alcohol use No     Comment: socially  . Drug use: No  . Sexual activity: Yes    Partners: Male    Birth control/ protection: Condom   Other  Topics Concern  . Not on file   Social History Narrative   Originally from [place]   [place]   [place]   Lives with her boyfriend   Right-handed   Caffeine: none     PHYSICAL EXAM  Vitals:   05/20/17 0912  BP: 97/67  Pulse: 74  Weight: 117 lb 11.2 oz (53.4 kg)  Height: 4\' 10"  (1.473 m)   Body mass index is 24.6 kg/m.  Generalized: Well developed, in no acute distress  Head: normocephalic and atraumatic,. Oropharynx benign  Neck: Supple,  Musculoskeletal: No deformity   Neurological examination   Mentation: Alert oriented to time, place, history taking. Attention span and concentration appropriate. Recent and remote memory intact.  Follows all commands speech and language fluent.   Cranial nerve II-XII: Pupils were equal round reactive to light extraocular movements were full, visual field were full on confrontational test. Facial sensation and strength were normal. hearing was intact to finger rubbing bilaterally. Uvula tongue midline. head turning and shoulder shrug were normal and symmetric.Tongue protrusion into cheek strength was normal. Motor: normal bulk and tone, full strength in the BUE, BLE, fine finger movements normal, no pronator drift. No focal weakness Sensory: normal and symmetric to light touch, in the face arms and legs  Coordination: finger-nose-finger, heel-to-shin bilaterally, no dysmetria Reflexes: Symmetric upper and lower plantar responses were flexor bilaterally. Gait and Station: Rising up from seated position without assistance, normal stance,  moderate stride, good arm swing, smooth turning, able to perform tiptoe, and heel walking without difficulty. Tandem gait is steady  DIAGNOSTIC DATA (LABS, IMAGING, TESTING) -ASSESSMENT AND PLAN 36 year old with history of right V3 distribution trigeminal neuralgia. She stopped her medication for 1 week and her pain returned. It took her at least a week to get it under  control and    PLAN: Continue Tegretol for facial pain twice daily Will check labs today to monitor adverse effects of Tegretol will refill once labs back Follow-up in 6 months Reviewed information on trigeminal neuralgia and that the attacks can be triggered by talking chewing washing her face or brushing her teeth patient made aware to stay on the medication twice a day. Avoid blast of hot or cold air , chew on the unaffected side this information was interpreted to spanish as a printout in [place] I spent 25 minutes in total face to face time with the patient more than 50% of which was spent counseling and coordination of care, reviewing test results reviewing medications and discussing and reviewing the diagnosis of trigeminal neuralgia through the interpreter and further treatment options. , Cline CrockNancy Carolyn Martin, GNP, [facility], APRN  [facility] [address], [address] [place], [place] [address] [phone]

## 2017-05-20 NOTE — Progress Notes (Signed)
I have read the note, and I agree with the clinical assessment and plan.  WILLIS,CHARLES KEITH   

## 2017-05-22 ENCOUNTER — Telehealth: Payer: Self-pay

## 2017-05-22 ENCOUNTER — Other Ambulatory Visit: Payer: Self-pay | Admitting: Nurse Practitioner

## 2017-05-22 LAB — CBC WITH DIFFERENTIAL/PLATELET
BASOS ABS: 0 10*3/uL (ref 0.0–0.2)
Basos: 1 %
EOS (ABSOLUTE): 0 10*3/uL (ref 0.0–0.4)
EOS: 1 %
HEMATOCRIT: 43.8 % (ref 34.0–46.6)
Hemoglobin: 14.3 g/dL (ref 11.1–15.9)
IMMATURE GRANS (ABS): 0 10*3/uL (ref 0.0–0.1)
Immature Granulocytes: 0 %
LYMPHS ABS: 1.9 10*3/uL (ref 0.7–3.1)
Lymphs: 47 %
MCH: 31.6 pg (ref 26.6–33.0)
MCHC: 32.6 g/dL (ref 31.5–35.7)
MCV: 97 fL (ref 79–97)
MONOCYTES: 9 %
Monocytes Absolute: 0.3 10*3/uL (ref 0.1–0.9)
Neutrophils Absolute: 1.7 10*3/uL (ref 1.4–7.0)
Neutrophils: 42 %
PLATELETS: 234 10*3/uL (ref 150–379)
RBC: 4.53 x10E6/uL (ref 3.77–5.28)
RDW: 13 % (ref 12.3–15.4)
WBC: 3.9 10*3/uL (ref 3.4–10.8)

## 2017-05-22 LAB — COMPREHENSIVE METABOLIC PANEL
ALBUMIN: 4.1 g/dL (ref 3.5–5.5)
ALT: 19 IU/L (ref 0–32)
AST: 20 IU/L (ref 0–40)
Albumin/Globulin Ratio: 1.4 (ref 1.2–2.2)
Alkaline Phosphatase: 58 IU/L (ref 39–117)
BUN / CREAT RATIO: 11 (ref 9–23)
BUN: 8 mg/dL (ref 6–20)
Bilirubin Total: 0.3 mg/dL (ref 0.0–1.2)
CO2: 24 mmol/L (ref 18–29)
CREATININE: 0.73 mg/dL (ref 0.57–1.00)
Calcium: 9.2 mg/dL (ref 8.7–10.2)
Chloride: 105 mmol/L (ref 96–106)
GFR calc non Af Amer: 106 mL/min/{1.73_m2} (ref >59)
GFR, EST AFRICAN AMERICAN: 123 mL/min/{1.73_m2} (ref >59)
GLUCOSE: 100 mg/dL — AB (ref 65–99)
Globulin, Total: 2.9 g/dL (ref 1.5–4.5)
Potassium: 5.2 mmol/L (ref 3.5–5.2)
Sodium: 142 mmol/L (ref 134–144)
Total Protein: 7 g/dL (ref 6.0–8.5)

## 2017-05-22 LAB — CARBAMAZEPINE LEVEL, TOTAL: Carbamazepine (Tegretol), S: 5.3 ug/mL (ref 4.0–12.0)

## 2017-05-22 MED ORDER — CARBAMAZEPINE 200 MG PO TABS: 200.0000 mg | ORAL_TABLET | Freq: Two times a day (BID) | ORAL | 6 refills | Status: DC

## 2017-05-22 NOTE — Telephone Encounter (Signed)
-----   Message from Nilda RiggsNancy Carolyn Martin, NP sent at 05/22/2017  9:43 AM EDT ----- Labs stable please call the patient will need an interpreter

## 2017-05-22 NOTE — Telephone Encounter (Signed)
Rn call the interpreter line at [phone]. Rn call to let patient know her labs were stable. Pt was not at home. The husband speaks spanish, and was told the results by the interpreter. Pts husband verbalized understanding ,and will tell his wife.

## 2017-07-23 ENCOUNTER — Telehealth: Payer: Self-pay | Admitting: Neurology

## 2017-07-23 NOTE — Telephone Encounter (Signed)
Pt calling for refill of carbamazepine (TEGRETOL) 200 MG tablet, please call into  [facility] [address] - [place] ([place]), [place] - [address] [phone] (Phone) [phone] (Fax)

## 2017-07-24 NOTE — Telephone Encounter (Signed)
Tried calling pt pharmacy. Pharmacy does not open until 9am. Rx recently sent with refills on 05/22/17 qty 60, 6 refills. She should have refills at pharmacy. Will try calling again once open.

## 2017-07-24 NOTE — Telephone Encounter (Signed)
Called and spoke with Latoya from pt pharmacy. She stated rx is on hold at pharmacy. Just waiting on pt to let them know to fill it. Advised I will call pt to let her know to contact them.  Called pt. Relayed message above. She verbalized understanding.

## 2017-07-28 NOTE — Telephone Encounter (Signed)
Noted  

## 2017-07-28 NOTE — Telephone Encounter (Signed)
Pt called the office, [facility] was not [facility]. She does not understand. Jael came to phone room, she was able to communicate to the patient the instructions, [facility] the [facility]. The pt will call.  FYI

## 2017-10-24 ENCOUNTER — Telehealth: Payer: Self-pay | Admitting: Internal Medicine

## 2017-10-24 NOTE — Telephone Encounter (Signed)
Patient called to inquire about Rx amitriptyline 25 mg tablet. Patient stated has an OV next month and will like a Rx at least for one month until comes to the appointment because cannot sleep without medicine.

## 2017-10-25 MED ORDER — AMITRIPTYLINE HCL 25 MG PO TABS: 25.0000 mg | ORAL_TABLET | Freq: Every day | ORAL | 0 refills | Status: DC

## 2017-10-28 NOTE — Telephone Encounter (Signed)
Spoke with patient. Informed [facility]. Patient verbalized understanding.

## 2017-11-06 ENCOUNTER — Ambulatory Visit: Payer: Self-pay | Admitting: Internal Medicine

## 2017-11-06 ENCOUNTER — Encounter: Payer: Self-pay | Admitting: Internal Medicine

## 2017-11-06 VITALS — BP 122/78 | HR 78 | Resp 12 | Ht <= 58 in | Wt 115.0 lb

## 2017-11-06 DIAGNOSIS — G4700 Insomnia, unspecified: Secondary | ICD-10-CM

## 2017-11-06 DIAGNOSIS — G500 Trigeminal neuralgia: Secondary | ICD-10-CM

## 2017-11-06 DIAGNOSIS — Z79899 Other long term (current) drug therapy: Secondary | ICD-10-CM

## 2017-11-06 DIAGNOSIS — L709 Acne, unspecified: Secondary | ICD-10-CM

## 2017-11-06 MED ORDER — TRETINOIN 0.05 % EX CREA: TOPICAL_CREAM | Freq: Every day | CUTANEOUS | 0 refills | Status: DC

## 2017-11-06 MED ORDER — AMITRIPTYLINE HCL 25 MG PO TABS: 25.0000 mg | ORAL_TABLET | Freq: Every day | ORAL | 3 refills | Status: DC

## 2017-11-06 NOTE — Progress Notes (Signed)
   Subjective:    Patient ID: Tabitha [name], female    DOB: 10/25/1981, 36 y.o.   MRN: [id number]  HPI   1.  Insomnia:  Needs Amitriptyline refilled.  Uses nightly for sleep.  2.  Acne, mild:  Would like her Retin A refilled as well.  3.  Right V3 Trigeminal Neuralgia:  Taking Carbamazepine.  Last bloodwork end of May.  She has an appointment on the 20th of this month for follow up.  She is wondering if she can follow up just in our clinic for this. Not clear how expensive the Carbemazepine level would be.   No pain when she takes the medication regularly.   [facility]  Current Meds  Medication Sig  . amitriptyline (ELAVIL) 25 MG tablet Take 1 tablet (25 mg total) by mouth at bedtime.  . carbamazepine (TEGRETOL) 200 MG tablet Take 1 tablet (200 mg total) by mouth 2 (two) times daily.  Marland Kitchen. tretinoin (RETIN-A) 0.05 % cream Apply topically at bedtime.    No Known Allergies    Review of Systems     Objective:   Physical Exam NAD HEENT:  PERRL, EOMI, TMs pearly gray, throat without injection. Neck:  Supple, No adenopathy, no thyromegaly Chest:  CTA CV:  RRR without murmur or rub, radial and DP pulses normal and equal. Abd:  S, NT, No HSM or mass, + BS Neuro:  A & O x 3, CN  II- XII grossly intact DTRs 2+/4/ motor 5/5 throughout. Sensory over face WNL Gait normal Skin:  Minimal acneiform lesions of face.       Assessment & Plan:  1.  Insomnia:  Controlled with Amitriptyline as needed at bedtime  2.  Acne:  Topical Retin A. Between this and Carbamazepine.  Patient to notify if contemplating pregnancy.  She states she is consistent with condoms.  3.  Right Trigeminal Neuralgia:  Patient pays $200 to see Neurology and would like to know if okay to follow up here.  Would like for her to go at least one more time to Neurology.  She states they were contemplating having her wean off Carbamazepine.  Sounds like she has been  relatively symptom free, but later states still with shooting pain with facial exposure to heat with cooking.  Drug level ordered today along with CBC and CMP for monitoring purposes

## 2017-11-07 LAB — COMPREHENSIVE METABOLIC PANEL
ALBUMIN: 4.6 g/dL (ref 3.5–5.5)
ALT: 21 IU/L (ref 0–32)
AST: 23 IU/L (ref 0–40)
Albumin/Globulin Ratio: 1.4 (ref 1.2–2.2)
Alkaline Phosphatase: 69 IU/L (ref 39–117)
BUN / CREAT RATIO: 13 (ref 9–23)
BUN: 9 mg/dL (ref 6–20)
Bilirubin Total: <0.2 mg/dL (ref 0.0–1.2)
CO2: 23 mmol/L (ref 20–29)
CREATININE: 0.69 mg/dL (ref 0.57–1.00)
Calcium: 9.1 mg/dL (ref 8.7–10.2)
Chloride: 102 mmol/L (ref 96–106)
GFR calc Af Amer: 130 mL/min/{1.73_m2} (ref >59)
GFR calc non Af Amer: 112 mL/min/{1.73_m2} (ref >59)
GLOBULIN, TOTAL: 3.4 g/dL (ref 1.5–4.5)
Glucose: 86 mg/dL (ref 65–99)
Potassium: 4.4 mmol/L (ref 3.5–5.2)
SODIUM: 138 mmol/L (ref 134–144)
TOTAL PROTEIN: 8 g/dL (ref 6.0–8.5)

## 2017-11-07 LAB — CBC WITH DIFFERENTIAL/PLATELET
Basophils Absolute: 0 10*3/uL (ref 0.0–0.2)
Basos: 1 %
EOS (ABSOLUTE): 0 10*3/uL (ref 0.0–0.4)
EOS: 0 %
HEMATOCRIT: 44 % (ref 34.0–46.6)
HEMOGLOBIN: 14.2 g/dL (ref 11.1–15.9)
IMMATURE GRANS (ABS): 0 10*3/uL (ref 0.0–0.1)
IMMATURE GRANULOCYTES: 0 %
LYMPHS ABS: 1.7 10*3/uL (ref 0.7–3.1)
Lymphs: 39 %
MCH: 30.4 pg (ref 26.6–33.0)
MCHC: 32.3 g/dL (ref 31.5–35.7)
MCV: 94 fL (ref 79–97)
MONOCYTES: 6 %
Monocytes Absolute: 0.3 10*3/uL (ref 0.1–0.9)
NEUTROS PCT: 54 %
Neutrophils Absolute: 2.4 10*3/uL (ref 1.4–7.0)
Platelets: 275 10*3/uL (ref 150–379)
RBC: 4.67 x10E6/uL (ref 3.77–5.28)
RDW: 13 % (ref 12.3–15.4)
WBC: 4.4 10*3/uL (ref 3.4–10.8)

## 2017-11-07 LAB — CARBAMAZEPINE LEVEL, TOTAL: Carbamazepine (Tegretol), S: 4.7 ug/mL (ref 4.0–12.0)

## 2017-11-17 NOTE — Progress Notes (Signed)
[facility]  PATIENT: Tabitha [name] DOB: 03/24/1981   REASON FOR VISIT: Follow-up for trigeminal neuralgia HISTORY FROM: Interpreter    HISTORY OF PRESENT ILLNESS: UPDATE 11/20/2018CM Tabitha [name], a 36 year old female returns for follow-up for right-sided trigeminal neuralgia that is well controlled on Tegretol.  History obtained through the interpreter.  Patient had recent labs done at primary care on 11/06/2017.  Carbamazepine level 4.7.  CMP and CBC within normal limits..  Patient has asked Dr. Delrae AlfredMulberry, her primary care provider if she would manage her facial pain and she says Dr. Delrae AlfredMulberry agreed.  Her symptoms are well controlled as long as she takes the medication.  She returns for reevaluation    UPDATE 05/20/2017 CM Tabitha [name], 36 year old female returns for follow-up with her interpreter she is not English-speaking. She has a history of right sided trigeminal neuralgia and was placed on Tegretol which was increased at her last visit with Dr. Anne HahnWillis. She stopped the medication for several weeks and her pain returned and she says it took her at least a week to get back under control. She is now taking Tegretol twice a day. Her symptoms seem to be fairly well controlled at present. She returns for reevaluation. She needs lab work   HISTORY 01/10/17 KWMs. Tabitha [name] is a 36 year old right-handed Hispanic female who is non-English-speaking. The patient comes in today with an interpreter. The patient developed right-sided lower jaw discomfort that began about 5 months ago. Initially she went to her dentist, but eventually she followed up with Dr. Purnell ShoemakerKaram, and she was referred to this office. The patient describes an electric shock sensation that goes into her lower face, this can be exacerbated by talking or chewing, or opening her mouth wide. Touching the right face may bring on pain as well. The patient denies any overt numbness of the face, she denies numbness or  weakness of the extremities. She denies any neck pain or visual complaints. The patient has not had any balance changes or difficulty controlling the bowels or the bladder. The patient recently was placed on carbamazepine 3 weeks ago taking 100 mg twice daily. The patient does get some pain benefit from this, but she does have breakthrough pain and occasionally she will double up on the medication. She comes in today for an evaluation.   REVIEW OF SYSTEMS: Full 14 system review of systems performed and notable only for those listed, all others are neg:  Constitutional: neg  Cardiovascular: neg Ear/Nose/Throat: neg  Skin: neg Eyes: neg Respiratory: neg Gastroitestinal: neg  Hematology/Lymphatic: neg  Endocrine: neg Musculoskeletal:neg Allergy/Immunology: neg Neurological: Facial pain well-controlled Psychiatric: neg Sleep : neg   ALLERGIES: No Known Allergies  HOME MEDICATIONS: Outpatient Medications Prior to Visit  Medication Sig Dispense Refill  . amitriptyline (ELAVIL) 25 MG tablet Take 1 tablet (25 mg total) at bedtime by mouth. 90 tablet 3  . carbamazepine (TEGRETOL) 200 MG tablet Take 1 tablet (200 mg total) by mouth 2 (two) times daily. 60 tablet 6  . tretinoin (RETIN-A) 0.05 % cream Apply at bedtime topically. 20 g 0   No facility-administered medications prior to visit.     PAST MEDICAL HISTORY: Past Medical History:  Diagnosis Date  . Breast cyst 2011   [place], [place]  . Chronic headaches 2016   Listed as tension and migranous at different times in old records.  Treated with Amitriptyline  . Cystic acne 2016   Treated with Retin A 0.05% cream in past  . Migraine   .  Recurrent UTI 2012 to present   Often difficult to resolve  . Trigeminal neuralgia 2017   Right sided  . Trigeminal neuralgia of right side of face 01/10/2017   Right V3 distribution    PAST SURGICAL HISTORY: Past Surgical History:  Procedure Laterality Date  . NO PAST SURGERIES      FAMILY  HISTORY: History reviewed. No pertinent family history.  SOCIAL HISTORY: Social History   Socioeconomic History  . Marital status: Married    Spouse name: Franciso Bendduardo Licona  . Number of children: 0  . Years of education: Primary  . Highest education level: Not on file  Social Needs  . Financial resource strain: Not on file  . Food insecurity - worry: Not on file  . Food insecurity - inability: Not on file  . Transportation needs - medical: Not on file  . Transportation needs - non-medical: Not on file  Occupational History  . Occupation: unemployed  Tobacco Use  . Smoking status: Light Tobacco Smoker    Packs/day: 0.25    Years: 7.00    Pack years: 1.75    Types: Cigarettes  . Smokeless tobacco: Never Used  . Tobacco comment: 4 cigarettes per month  Substance and Sexual Activity  . Alcohol use: No    Alcohol/week: 0.0 oz    Comment: socially  . Drug use: No  . Sexual activity: Yes    Partners: Male    Birth control/protection: Condom  Other Topics Concern  . Not on file  Social History Narrative   Originally [place]   [place]   [place] in [place] 2[place]   Lives with her boyfriend   Right-handed   Caffeine: none     PHYSICAL EXAM  Vitals:   11/18/17 1027  BP: 108/66  Pulse: 86  Weight: 116 lb 9.6 oz (52.9 kg)  Height: 4\' 10"  (1.473 m)   Body mass index is 24.37 kg/m.  Generalized: Well developed, in no acute distress  Head: normocephalic and atraumatic,. Oropharynx benign  Neck: Supple,  Musculoskeletal: No deformity   Neurological examination   Mentation: Alert oriented to time, place, history taking. Attention span and concentration appropriate. Recent and remote memory intact.  Follows all commands speech and language fluent.   Cranial nerve II-XII: Pupils were equal round reactive to light extraocular movements were full, visual field were full on confrontational test. Facial sensation and strength were normal. hearing was  intact to finger rubbing bilaterally. Uvula tongue midline. head turning and shoulder shrug were normal and symmetric.Tongue protrusion into cheek strength was normal. Motor: normal bulk and tone, full strength in the BUE, BLE,  Sensory: normal and symmetric to light touch, in the face arms and legs  Coordination: finger-nose-finger, heel-to-shin bilaterally, no dysmetria Reflexes: Symmetric upper and lower plantar responses were flexor bilaterally. Gait and Station: Rising up from seated position without assistance, normal stance,  moderate stride, good arm swing, smooth turning, able to perform tiptoe, and heel walking without difficulty. Tandem gait is steady  DIAGNOSTIC DATA (LABS, IMAGING, TESTING) -ASSESSMENT AND PLAN 36 year old with history of right V3 distribution trigeminal neuralgia.  Her symptoms are well controlled with Tegretol 200 mg twice daily     PLAN: Continue Tegretol for facial pain twice daily we will refill for 6 months Reviewed recent CBC CMP WNL from 11/06/17  Carbamazepine level from 11/06/2017 4.7 which is therapeutic Patient wishes to follow-up with her primary care,  [facility] for her facial pain and since it  is stable that is reasonable.   Will discharge Tabitha [name], [facility], [facility], APRN  [facility] [address], [address] [place], [place] [address] [phone]

## 2017-11-18 ENCOUNTER — Ambulatory Visit: Payer: Self-pay | Admitting: Nurse Practitioner

## 2017-11-18 ENCOUNTER — Encounter: Payer: Self-pay | Admitting: Nurse Practitioner

## 2017-11-18 VITALS — BP 108/66 | HR 86 | Ht <= 58 in | Wt 116.6 lb

## 2017-11-18 DIAGNOSIS — G500 Trigeminal neuralgia: Secondary | ICD-10-CM

## 2017-11-18 MED ORDER — CARBAMAZEPINE 200 MG PO TABS: 200.0000 mg | ORAL_TABLET | Freq: Two times a day (BID) | ORAL | 6 refills | Status: DC

## 2017-11-18 NOTE — Progress Notes (Signed)
I have read the note, and I agree with the clinical assessment and plan.  Tabitha [name]   

## 2017-11-18 NOTE — Patient Instructions (Signed)
Continue Tegretol for facial pain twice daily we will refill for 6 months Reviewed recent CBC CMP and carbamazepine level from 11/06/2017 Patient wishes to follow-up with her primary care [facility] for her [facility] pain and since it is stable that is reasonable.   Will discharge

## 2018-05-07 ENCOUNTER — Ambulatory Visit: Payer: Self-pay | Admitting: Internal Medicine

## 2018-06-18 ENCOUNTER — Ambulatory Visit: Payer: Self-pay | Admitting: Internal Medicine

## 2018-06-18 ENCOUNTER — Encounter: Payer: Self-pay | Admitting: Internal Medicine

## 2018-06-18 VITALS — BP 100/60 | HR 74 | Resp 12 | Ht <= 58 in | Wt 119.0 lb

## 2018-06-18 DIAGNOSIS — L853 Xerosis cutis: Secondary | ICD-10-CM

## 2018-06-18 DIAGNOSIS — G4700 Insomnia, unspecified: Secondary | ICD-10-CM

## 2018-06-18 DIAGNOSIS — Z3009 Encounter for other general counseling and advice on contraception: Secondary | ICD-10-CM

## 2018-06-18 DIAGNOSIS — G500 Trigeminal neuralgia: Secondary | ICD-10-CM

## 2018-06-18 MED ORDER — TEA TREE 100 % EX OIL: TOPICAL_OIL | CUTANEOUS | Status: DC

## 2018-06-18 MED ORDER — FOLIC ACID 1 MG PO TABS: 1.0000 mg | ORAL_TABLET | Freq: Every day | ORAL | 11 refills | Status: DC

## 2018-06-18 NOTE — Progress Notes (Signed)
   Subjective:    Patient ID: Tabitha [name], female    DOB: 08/29/1981, 37 y.o.   MRN: [id number]  HPI   1.  Right V3 Trigeminal Neuralgia: She stopped the Carbamazepine 3 weeks ago and her pain has not returned.  2.  Insomnia:  She is trying to stop Amitriptyline 25 mg at bedtime.  She has not tried to decrease her dose.  The longest she can go without taking the med is 5 days, then starts having problems with sleep.   She is exercising on days she plans to not take a pill at bedtime. Exercises 2 hours before going to bed.   Makes dog clothes in the evening. Has tried listening to calming music Watches TV or talks on phone when cannot fall asleep. No caffeine after lunch.  3.  Thinking of trying to get pregnant.  She is not taking a prenatal vitamins.  Discussed needs to wean off Amitriptyline.  4.  Feels she has fungus of feet.  Nails not involved--painted today.  Dry peeling and itchy skin, mainly about heel.   Current Meds  Medication Sig  . amitriptyline (ELAVIL) 25 MG tablet Take 1 tablet (25 mg total) at bedtime by mouth.  . tretinoin (RETIN-A) 0.05 % cream Apply at bedtime topically.    No Known Allergies Review of Systems     Objective:   Physical Exam NAD HEENT: PERRL, EOMI, NT over right face at all levels.   Neck:  Supple, No adenopathy Chest:  CTA CV:  RRR without murmur or rub.  Radial and DP pulses normal and equal.   LE:  No edema.  Feet with flaking about edge of plantar heels bilaterally.      Assessment & Plan:  1.  Trigeminal Neuralgia:  Has been off Carbemazepine for 3 weeks without pain.  To notify clinic if pain returns.  2.  Insomnia:  Recommended not exercising so close to bedtime--to try to make it 4 hours or more. Avoid TV and phone/other video when unable to sleep--read instead. Decrease Amitriptyline dosing to half at bedtime and then go every other day, every 3rd day etc before stopping completely.  Avoid sudden discontinuation of  medication.  3.  Family planning:  Needs to wait until off Amitriptyline for a month or more.  Start folic acid 1 mg daily by mouth  4.  Dry feet:  Likely wet to dry causing this.  Recommend gold bond foot cream mixed with tea tree oil to apply daily after shower and at bedtime.

## 2018-07-14 ENCOUNTER — Encounter: Payer: Self-pay | Admitting: Internal Medicine

## 2018-07-14 ENCOUNTER — Ambulatory Visit: Payer: Self-pay | Admitting: Internal Medicine

## 2018-07-14 VITALS — BP 102/70 | HR 76 | Resp 12 | Ht <= 58 in | Wt 118.0 lb

## 2018-07-14 DIAGNOSIS — L509 Urticaria, unspecified: Secondary | ICD-10-CM

## 2018-07-14 MED ORDER — PREDNISONE 20 MG PO TABS: ORAL_TABLET | ORAL | 0 refills | Status: DC

## 2018-07-14 MED ORDER — LORATADINE 10 MG PO TABS: ORAL_TABLET | ORAL | 1 refills | Status: DC

## 2018-07-14 MED ORDER — DIPHENHYDRAMINE HCL 25 MG PO TABS: 25.0000 mg | ORAL_TABLET | Freq: Every evening | ORAL | 0 refills | Status: DC | PRN

## 2018-07-14 NOTE — Patient Instructions (Signed)
Stay away from milk or milk products for 2 weeks Stay away from pork, beef, deer, goat, lamb meat for 2 weeks. Gradually add meet back one by one over several days and pay attention to when you eat it and whether you develop a rash again. Then start back with milk again and pay attention to time you drink and whether or not you develop a rash later

## 2018-07-14 NOTE — Progress Notes (Signed)
   Subjective:    Patient ID: Tabitha [name], female    DOB: 04/27/1981, 37 y.o.   MRN: [id number]  HPI   Two days ago, ate vanilla ice cream while in [place].  Started itching on her posterior neck and arms/axillae areas about 30 minutes later.  Eventually itching just about everywhere save for her face or her legs below the knee.  Shows photos of her low trunk with large elevated flat areas with surrounding erythema.   The rash comes and goes. She is drinking milk and lemon water to treat.   She feels the rash is just as bad since Sunday, maybe worse. Feels she is itching more. She cannot say she has been exposed to anything new other than after eating the ice cream.  She only had 2-3 spoons of plain vanilla ice cream from a cup.  She felt it was too sweet, so she threw the rest away.    Patient began drinking milk 2 nights ago as a remedy recommended by her mother.  Slept okay the first night after starting the milk intake and awakened the next morning without rash. Continued to drink the milk yesterday throughout the day, then rash appeared again about 6-6:30 p.m. She ate pork 3 days ago.  Ate pork again 2 days ago.    Has not had other meats besides chicken since.   Ate oatmeal with water and strawberries today.  A small cookie she believes with dark chocolate chips.    Denies dyspnea or swelling of airways.  She does do yard work regularly.  Cannot recall a tick bite during or after her yard work.  Current Meds  Medication Sig  . amitriptyline (ELAVIL) 25 MG tablet Take 1 tablet (25 mg total) at bedtime by mouth.  . folic acid (FOLVITE) 1 MG tablet Take 1 tablet (1 mg total) by mouth daily.  . Tea Tree 100 % OIL Mezcla con Gold Bond Foot Cream and apply twice daily to feet.  . tretinoin (RETIN-A) 0.05 % cream Apply at bedtime topically.   No Known Allergies  Review of Systems     Objective:   Physical Exam Scratching throughout interaction HEENT: PERRL, EOMI,  conjunctivae without injection.  TMs pearly gray, throat without injection. Neck: Supple, No adenopathy Chest:  CTA CV:  RRR without murmur or rub, radial and DP pulses normal and equal Skin:  Scattered mainly large flat elevations with erythema:  Arms, chest, back.  No involvement of face or legs.      Assessment & Plan:  Urticaria:  Not clear of cause.  Hold all milk products and meats other than poultry or fish or eggs for 2 weeks. Prednisone 20 mg daily for 5 days. Claritin 10 mg daily in morning. Benadryl 25 mg at bedtime as needed. If urticaria resolves, may begin introducing meats gradually and pay attention to whether urticaria recurs.   May begin milk products after back on all meats without a problem and again pay attention to whether urticaria recurs after ingestion.

## 2018-10-15 ENCOUNTER — Ambulatory Visit: Payer: Self-pay | Admitting: Internal Medicine

## 2018-12-17 ENCOUNTER — Ambulatory Visit: Payer: Self-pay | Admitting: Internal Medicine

## 2018-12-17 ENCOUNTER — Encounter: Payer: Self-pay | Admitting: Internal Medicine

## 2018-12-17 VITALS — BP 122/80 | HR 72 | Resp 12 | Ht <= 58 in | Wt 116.0 lb

## 2018-12-17 DIAGNOSIS — K0510 Chronic gingivitis, plaque induced: Secondary | ICD-10-CM

## 2018-12-17 DIAGNOSIS — G4700 Insomnia, unspecified: Secondary | ICD-10-CM

## 2018-12-17 DIAGNOSIS — L509 Urticaria, unspecified: Secondary | ICD-10-CM

## 2018-12-17 MED ORDER — PENICILLIN V POTASSIUM 250 MG PO TABS: ORAL_TABLET | ORAL | 0 refills | Status: DC

## 2018-12-17 MED ORDER — AMITRIPTYLINE HCL 25 MG PO TABS: ORAL_TABLET | ORAL | 3 refills | Status: DC

## 2018-12-17 NOTE — Patient Instructions (Addendum)
Tabitha GammonCarlos Ruiz [name]  If you are using Retin A, would also stop this before trying to get pregnant  [facility] for teeth cleaning.

## 2018-12-17 NOTE — Progress Notes (Signed)
   Subjective:   Patient ID: Tabitha [name], female    DOB: 12/14/1981, 37 y.o.   MRN: [id number]  HPI   1.  Insomnia:  Taking Amitriptyline 25 mg 1/2 tab by mouth every other day since last here with urticaria.   Previously, she was sleeping 9-10 hours, now 5-6 hours.  Not refreshed in the morning. She sleeps less every night, not on the nights when she takes no Amitriptyline. When she is unable to get back to sleep, generally she notes she feels "nervous"  Because she just wants to sleep.  Watching the clock.   Then develops pain in her neck and head thinking about getting back to sleep.   She denies increased stress. She is having headaches nuchal/forehead area and at her posterior neck only for the past month.   She reads a book 1 hour before going to bed.  Able to initiate sleep fine, but wakes and cannot get back to sleep.  Tries to read in bed, but does not seem to help. Exercises now in the morning instead of evening. Gets outside every day.    Sounds like she is still wanting to eventually try to get pregnant,but not clear how serious she is about this.   2.  Urticaria:  She clarified with trying food out that beef and pork causes urticaria.  If she eats a small amount of either, she is fine, but if she eats more, she will have the urticaria.  Milk does not seem to cause the rash  3.  Gums have been hurting for 3 weeks, left mandibular area.  Not localized around one tooth.  They do bleed.  She does floss.  Current Meds  Medication Sig  . amitriptyline (ELAVIL) 25 MG tablet Take 1 tablet (25 mg total) at bedtime by mouth.  . diphenhydrAMINE (BENADRYL) 25 MG tablet Take 1 tablet (25 mg total) by mouth at bedtime as needed.  . folic acid (FOLVITE) 1 MG tablet Take 1 tablet (1 mg total) by mouth daily.  Marland Kitchen. loratadine (CLARITIN) 10 MG tablet 1 tab by mouth daily in the morning  . Tea Tree 100 % OIL Mezcla con Gold Bond Foot Cream and apply twice daily to feet.  . tretinoin  (RETIN-A) 0.05 % cream Apply at bedtime topically.    No Known Allergies    Review of Systems    Objective:  Physical Exam  NAD HEENT:  PERRL, EOMI,  Tartar at gum line on lingual side of teeth.  Mild inflammation of gingiva, lower right mandibular.  No fluctuance of gingiva. Neck:  Supple, No adenopathy Chest:  CTA CV:  RRR without murmur or rub.     Assessment & Plan:  1.  Insomnia:  Wean off Amitriptyline.  Seems to be causing more problems. Discussed good sleep hygiene and not lying in bed awake.   To go to another room and read if not falling asleep or returning to sleep in 20 minutes. Regular daily physical activity. Avoiding caffeine after lunch. Exposure to outdoors/sun daily.  2.  Gingivitis:  pencillin VK 250 mg 4 times daily for 7 days.  Brush and floss regularly.  3.  Urticaria:  She feels she has found the cause with large portions of meat.  interpreted

## 2019-04-15 ENCOUNTER — Ambulatory Visit: Payer: Self-pay | Admitting: Internal Medicine

## 2019-06-17 ENCOUNTER — Ambulatory Visit: Payer: Self-pay | Admitting: Internal Medicine

## 2019-08-02 ENCOUNTER — Telehealth: Payer: Self-pay | Admitting: Internal Medicine

## 2019-08-02 NOTE — Telephone Encounter (Signed)
Patient called requesting Rx on amitriptyline (ELAVIL) 25 MG tablet.  Please advise.

## 2019-08-03 ENCOUNTER — Other Ambulatory Visit: Payer: Self-pay

## 2019-08-03 MED ORDER — AMITRIPTYLINE HCL 25 MG PO TABS: ORAL_TABLET | ORAL | 0 refills | Status: DC

## 2019-08-03 NOTE — Telephone Encounter (Signed)
Rx sent to pharmacy   

## 2019-09-09 ENCOUNTER — Encounter: Payer: Self-pay | Admitting: Internal Medicine

## 2019-09-09 ENCOUNTER — Ambulatory Visit: Payer: Self-pay | Admitting: Internal Medicine

## 2019-09-09 ENCOUNTER — Other Ambulatory Visit: Payer: Self-pay

## 2019-09-09 VITALS — BP 120/60 | HR 68 | Resp 12 | Ht <= 58 in | Wt 116.0 lb

## 2019-09-09 DIAGNOSIS — Z319 Encounter for procreative management, unspecified: Secondary | ICD-10-CM

## 2019-09-09 DIAGNOSIS — Z87828 Personal history of other (healed) physical injury and trauma: Secondary | ICD-10-CM

## 2019-09-09 DIAGNOSIS — G4700 Insomnia, unspecified: Secondary | ICD-10-CM

## 2019-09-09 DIAGNOSIS — Z23 Encounter for immunization: Secondary | ICD-10-CM

## 2019-09-09 DIAGNOSIS — R102 Pelvic and perineal pain: Secondary | ICD-10-CM

## 2019-09-09 LAB — POCT URINALYSIS DIPSTICK
Bilirubin, UA: NEGATIVE
Blood, UA: NEGATIVE
Glucose, UA: NEGATIVE
Leukocytes, UA: NEGATIVE
Nitrite, UA: NEGATIVE
Protein, UA: NEGATIVE
Spec Grav, UA: 1.01 (ref 1.010–1.025)
Urobilinogen, UA: 0.2 E.U./dL
pH, UA: 7.5 (ref 5.0–8.0)

## 2019-09-09 LAB — POCT URINE PREGNANCY: Preg Test, Ur: NEGATIVE

## 2019-09-09 LAB — POCT WET PREP WITH KOH
Clue Cells Wet Prep HPF POC: NEGATIVE
KOH Prep POC: NEGATIVE
RBC Wet Prep HPF POC: NEGATIVE
Trichomonas, UA: NEGATIVE
Yeast Wet Prep HPF POC: NEGATIVE

## 2019-09-09 MED ORDER — TRAZODONE HCL 50 MG PO TABS: 25.0000 mg | ORAL_TABLET | Freq: Every evening | ORAL | 3 refills | Status: DC | PRN
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
## 2019-09-09 NOTE — Patient Instructions (Signed)
No mas amitriptyline

## 2019-09-13 LAB — GC/CHLAMYDIA PROBE AMP
Chlamydia trachomatis, NAA: NEGATIVE
Neisseria Gonorrhoeae by PCR: NEGATIVE

## 2019-09-17 ENCOUNTER — Telehealth: Payer: Self-pay | Admitting: Internal Medicine

## 2019-09-17 NOTE — Telephone Encounter (Signed)
Patient called stating was taking Trazodone 50 mg at bedtime since Thursday 09/09/19 and stopped taking it on Wednesday 09/15/19. Patient states was able to sleep when was taking med but was waking up with headache and anxiety next day. Patient stated will like to be prescribed with the previous medication she was taken prior Trazodone.  Please advise.

## 2019-09-20 NOTE — Telephone Encounter (Signed)
To Dr. Mulberry for further directions 

## 2019-09-22 NOTE — Telephone Encounter (Signed)
See if she would be willing to try half tab or 25 mg at bedtime and see if side effects are better.  If not, will go back to Amitriptyline.

## 2019-09-22 NOTE — Telephone Encounter (Signed)
To Dr. Amil Amen

## 2019-09-22 NOTE — Telephone Encounter (Signed)
Patient called back to get any updated on her concerns from Friday 09/17/2019.

## 2019-09-23 NOTE — Telephone Encounter (Signed)
Referral for counseling from Dr.Mullberry, patient presents with insomnia and anxiety. Spoke to patient to schedule an appointment. Patient states she does not feel she needs counseling because she believes her insomnia and anxiety are cause by the medication that she is taking for her headaches. Patient states the mediation she is taking is Trazodone and has stopped taking it due to insomnia and anxiety. SWK encourage the patient to schedule an appointment when she feel ready to meet for counseling. SWK will informed Dr. Tawni Carnes that the patient has stopped taking the Trazodone.

## 2019-09-27 NOTE — Telephone Encounter (Signed)
To Nicky to notify patient 

## 2019-09-27 NOTE — Telephone Encounter (Signed)
To Dr. Mulberry FYI 

## 2019-09-27 NOTE — Telephone Encounter (Signed)
Spoke with patient and asked if she will be willing to try half tab or 25 mg at bedtime and call us back in a week to see if side effects are better. Patient stated will like to scheduled appointment to see Dr. Amil Amen for Migraine then changed idea and stated the appointment will be to discuss wants to come back to her previous medication and that was told to try this one and if new one does not work will be switch over to the previous one. At the end of the call patient stated will try 25 mg for a week and will call us back with the progress report.

## 2019-11-04 ENCOUNTER — Encounter: Payer: Self-pay | Admitting: Internal Medicine

## 2019-11-04 ENCOUNTER — Ambulatory Visit (INDEPENDENT_AMBULATORY_CARE_PROVIDER_SITE_OTHER): Payer: Self-pay | Admitting: Internal Medicine

## 2019-11-04 ENCOUNTER — Other Ambulatory Visit: Payer: Self-pay

## 2019-11-04 VITALS — BP 118/70 | HR 74 | Resp 12 | Ht <= 58 in | Wt 116.0 lb

## 2019-11-04 DIAGNOSIS — N3000 Acute cystitis without hematuria: Secondary | ICD-10-CM

## 2019-11-04 LAB — POCT URINALYSIS DIPSTICK
Bilirubin, UA: NEGATIVE
Blood, UA: NEGATIVE
Glucose, UA: NEGATIVE
Ketones, UA: NEGATIVE
Nitrite, UA: POSITIVE
Protein, UA: NEGATIVE
Spec Grav, UA: 1.01 (ref 1.010–1.025)
Urobilinogen, UA: 1 E.U./dL
pH, UA: 8 (ref 5.0–8.0)

## 2019-11-04 MED ORDER — CEPHALEXIN 250 MG PO CAPS: 250.0000 mg | ORAL_CAPSULE | Freq: Four times a day (QID) | ORAL | 0 refills | Status: DC

## 2019-11-04 NOTE — Progress Notes (Signed)
    Subjective:    Patient ID: Tabitha [name], female   DOB: 15-Aug-1981, 38 y.o.   MRN: [id number]   HPI   Problems with dysuria for 5 days.  No hematuria, fever, flank pain.  She has had urinary frequency.   No vaginal discharge. She did not have intercourse just before her symptoms. They did not use protection (condom) with intercourse a week before her symptoms with urination.    Current Meds  Medication Sig  . folic acid (FOLVITE) 1 MG tablet Take 1 tablet (1 mg total) by mouth daily.  Marland Kitchen ibuprofen (ADVIL) 200 MG tablet Take 200 mg by mouth every 6 (six) hours as needed.  . loratadine (CLARITIN) 10 MG tablet 1 tab by mouth daily in the morning  . tretinoin (RETIN-A) 0.05 % cream Apply at bedtime topically.   No Known Allergies   Review of Systems    Objective:   BP 118/70 (BP Location: Left Arm, Patient Position: Sitting, Cuff Size: Normal)   Pulse 74   Resp 12   Ht 4' 9.5" (1.461 m)   Wt 116 lb (52.6 kg)   LMP 10/14/2019   BMI 24.67 kg/m   Physical Exam  Abd:  S, supra pubic tenderness.  No flank tenderness. + BS.  No HSM    Assessment & Plan  Probably cystitis:  Recent intercourse without protection. Cephalexin 250 mg 4 times daily for 7 days until have culture back Push fluids Call if no improvement

## 2019-11-04 NOTE — Patient Instructions (Signed)
Drink  lots of water

## 2019-11-06 LAB — URINE CULTURE

## 2019-12-03 ENCOUNTER — Other Ambulatory Visit: Payer: Self-pay

## 2019-12-03 DIAGNOSIS — R300 Dysuria: Secondary | ICD-10-CM

## 2019-12-03 DIAGNOSIS — R102 Pelvic and perineal pain: Secondary | ICD-10-CM

## 2019-12-03 LAB — POCT URINALYSIS DIPSTICK
Bilirubin, UA: NEGATIVE
Glucose, UA: NEGATIVE
Ketones, UA: NEGATIVE
Leukocytes, UA: NEGATIVE
Nitrite, UA: NEGATIVE
Protein, UA: NEGATIVE
Spec Grav, UA: 1.015 (ref 1.010–1.025)
Urobilinogen, UA: 0.2 E.U./dL
pH, UA: 6.5 (ref 5.0–8.0)

## 2019-12-05 LAB — URINE CULTURE: Organism ID, Bacteria: NO GROWTH

## 2019-12-16 ENCOUNTER — Encounter: Payer: Self-pay | Admitting: Internal Medicine

## 2019-12-16 ENCOUNTER — Other Ambulatory Visit: Payer: Self-pay

## 2019-12-16 ENCOUNTER — Ambulatory Visit: Payer: Self-pay | Admitting: Internal Medicine

## 2019-12-16 ENCOUNTER — Telehealth: Payer: Self-pay | Admitting: Internal Medicine

## 2019-12-16 VITALS — BP 110/70 | HR 78 | Temp 98.6°F | Resp 12 | Ht 59.0 in | Wt 114.5 lb

## 2019-12-16 DIAGNOSIS — R300 Dysuria: Secondary | ICD-10-CM

## 2019-12-16 DIAGNOSIS — G4700 Insomnia, unspecified: Secondary | ICD-10-CM

## 2019-12-16 DIAGNOSIS — N898 Other specified noninflammatory disorders of vagina: Secondary | ICD-10-CM

## 2019-12-16 LAB — POCT URINALYSIS DIPSTICK
Bilirubin, UA: NEGATIVE
Blood, UA: NEGATIVE
Glucose, UA: NEGATIVE
Ketones, UA: NEGATIVE
Leukocytes, UA: NEGATIVE
Nitrite, UA: NEGATIVE
Protein, UA: NEGATIVE
Spec Grav, UA: 1.015 (ref 1.010–1.025)
Urobilinogen, UA: NEGATIVE E.U./dL — AB
pH, UA: 6 (ref 5.0–8.0)

## 2019-12-16 LAB — POCT WET PREP WITH KOH
KOH Prep POC: NEGATIVE
RBC Wet Prep HPF POC: NEGATIVE
Trichomonas, UA: NEGATIVE
Yeast Wet Prep HPF POC: NEGATIVE

## 2019-12-16 MED ORDER — METRONIDAZOLE 500 MG PO TABS: ORAL_TABLET | ORAL | 0 refills | Status: DC

## 2019-12-16 MED ORDER — AMITRIPTYLINE HCL 25 MG PO TABS: 25.0000 mg | ORAL_TABLET | Freq: Every day | ORAL | 11 refills | Status: DC

## 2019-12-16 MED ORDER — TRETINOIN 0.05 % EX CREA: TOPICAL_CREAM | Freq: Every day | CUTANEOUS | 0 refills | Status: DC

## 2019-12-16 NOTE — Progress Notes (Signed)
    Subjective:    Patient ID: Tabitha [name], female   DOB: 1981-06-11, 38 y.o.   MRN: [id number]   HPI   1.  Insomnia:  Felt the Trazodone caused headaches and anxiety at 25 or 50 mg dosing.  Tried for 3 weeks.   Patient states she does not feel her mind is racing--feels she is able to make her mind blank and just cannot get to sleep. She would prefer going back on Amitriptyline.  She feels she is doing daytime/nighttime activities, diet, etc to improve sleep hygiene, but has not helped. Have tried to get her to consider counseling, but has not wanted to pursue.   2.  Dysuria:  Still with mild burning on urination--at external urethral/vulvar area.    Also with urinary frequency.  Has a fishy odor.  Denies vaginal discharge. Had UTI with E coli in November, but negative culture in December. Wet prep negative for significant abnormality in September   Current Meds  Medication Sig  . dimenhyDRINATE (DRAMAMINE) 50 MG tablet Take 50 mg by mouth every 8 (eight) hours as needed. Takes two tables daily  . folic acid (FOLVITE) 1 MG tablet Take 1 tablet (1 mg total) by mouth daily.  Marland Kitchen ibuprofen (ADVIL) 200 MG tablet Take 200 mg by mouth every 6 (six) hours as needed.  . loratadine (CLARITIN) 10 MG tablet 1 tab by mouth daily in the morning  . Tea Tree 100 % OIL Mezcla con Gold Bond Foot Cream and apply twice daily to feet.  . tretinoin (RETIN-A) 0.05 % cream Apply at bedtime topically.   No Known Allergies   Review of Systems    Objective:   BP 110/70 (BP Location: Right Arm, Patient Position: Sitting, Cuff Size: Normal)   Pulse 78   Temp 98.6 F (37 C)   Resp 12   Ht 4\' 11"  (1.499 m)   Wt 114 lb 8 oz (51.9 kg)   LMP 12/13/2019 (Exact Date)   BMI 23.13 kg/m   Physical Exam  Appears tired Lungs:  CTA CV:  RRR without murmur or rub.  Radial pulses normal and equal Abd:  No flank tenderness.  + BS, No HSM or mass, + BS. Patient obtained vaginal self swab   Assessment  & Plan   1.  Insomnia:  To continue sleep hygiene improvement Switch back to Amitriptyline 25 mg at bedtime--she know to stop when having unprotected sex again.  2.  Mild BV:  Metornidazole 500 mg twice daily for 7 days.  3.  Acne: refill of topical Tretinoin at patient's request

## 2019-12-28 ENCOUNTER — Other Ambulatory Visit: Payer: Self-pay

## 2019-12-28 DIAGNOSIS — R300 Dysuria: Secondary | ICD-10-CM

## 2019-12-28 LAB — POCT URINALYSIS DIPSTICK
Bilirubin, UA: NEGATIVE
Glucose, UA: NEGATIVE
Ketones, UA: NEGATIVE
Nitrite, UA: NEGATIVE
Protein, UA: NEGATIVE
Spec Grav, UA: <=1.005 — AB (ref 1.010–1.025)
Urobilinogen, UA: 0.2 E.U./dL
pH, UA: 6.5 (ref 5.0–8.0)

## 2019-12-28 MED ORDER — CIPROFLOXACIN HCL 500 MG PO TABS: 500.0000 mg | ORAL_TABLET | Freq: Two times a day (BID) | ORAL | 0 refills | Status: AC

## 2019-12-28 MED ORDER — PHENAZOPYRIDINE HCL 200 MG PO TABS: ORAL_TABLET | ORAL | 0 refills | Status: DC

## 2019-12-28 NOTE — Progress Notes (Signed)
Patient with frequency, burning, pressure and some odor x 4 days. Per Dr. Amil Amen start patient on Cipro 500 mg 1 twice a day for 3 days while waiting on culture to come back. Patient may also have pyridium 200 mg 1 every 8 hours for 6 doses. Nicky notified patient of Dr. Melissa Noon message

## 2019-12-30 LAB — URINE CULTURE: Organism ID, Bacteria: NO GROWTH

## 2020-01-06 ENCOUNTER — Other Ambulatory Visit: Payer: Self-pay

## 2020-01-06 ENCOUNTER — Encounter: Payer: Self-pay | Admitting: Internal Medicine

## 2020-01-06 ENCOUNTER — Ambulatory Visit: Payer: Self-pay | Admitting: Internal Medicine

## 2020-01-06 VITALS — BP 102/60 | HR 78 | Resp 12 | Ht 59.0 in | Wt 116.0 lb

## 2020-01-06 DIAGNOSIS — N898 Other specified noninflammatory disorders of vagina: Secondary | ICD-10-CM

## 2020-01-06 DIAGNOSIS — R300 Dysuria: Secondary | ICD-10-CM

## 2020-01-06 LAB — POCT WET PREP WITH KOH
Clue Cells Wet Prep HPF POC: NEGATIVE
KOH Prep POC: NEGATIVE
RBC Wet Prep HPF POC: NEGATIVE
Trichomonas, UA: NEGATIVE
Yeast Wet Prep HPF POC: NEGATIVE

## 2020-01-06 NOTE — Progress Notes (Signed)
    Subjective:    Patient ID: Tabitha [name], female   DOB: 05/11/1981, 39 y.o.   MRN: [id number]   HPI   Returns to clinic as continues with symptoms. Presented to clinic on 12/28/2019 with 4 days of dysuria, urinary frequency.  Maybe some itching.  Noted odor with urination only. UA showed large leuks and blood,  But negative nitrite.  She was not having a period at the time. Treated with 3 day course of Cipro and 2 days or pyridium. The dysuria did not improve, but the odor did. She was drinking lots of water. Now she is having yellow vaginal discharge with more itching. Her husband is not having any symptoms. No fever.     Current Meds  Medication Sig  . amitriptyline (ELAVIL) 25 MG tablet Take 1 tablet (25 mg total) by mouth at bedtime.  . dimenhyDRINATE (DRAMAMINE) 50 MG tablet Take 50 mg by mouth every 8 (eight) hours as needed. Takes two tables daily  . folic acid (FOLVITE) 1 MG tablet Take 1 tablet (1 mg total) by mouth daily.  Marland Kitchen ibuprofen (ADVIL) 200 MG tablet Take 200 mg by mouth every 6 (six) hours as needed.  . loratadine (CLARITIN) 10 MG tablet 1 tab by mouth daily in the morning  . Methenamine-Sodium Salicylate (CYSTEX PO) Take by mouth. 6 a day  . Tea Tree 100 % OIL Mezcla con Gold Bond Foot Cream and apply twice daily to feet.  . tretinoin (RETIN-A) 0.05 % cream Apply topically at bedtime.   No Known Allergies   Review of Systems    Objective:   BP 102/60 (BP Location: Left Arm, Patient Position: Sitting, Cuff Size: Normal)   Pulse 78   Resp 12   Ht 4\' 11"  (1.499 m)   Wt 116 lb (52.6 kg)   LMP 12/13/2019 (Exact Date)   BMI 23.43 kg/m   Physical Exam  NAD Abd:  S, NT, No HSM or mass, + BS GU: normal external female genitalia.  Minimal vaginal lubrication.  No odor.  No vaginal or cervical mucosal inflammation or lesion..  No CMT   Assessment & Plan  Continued dysuria with vaginal discharge by history:  No findings on exam.  Send urine for  culture again Wet prep/KOH negative for abnormality as well.  Send GC/chlamydia. No further treatment for now, unless labs return with findings.

## 2020-01-06 NOTE — Patient Instructions (Signed)
Tome mucho agua  

## 2020-01-08 LAB — GC/CHLAMYDIA PROBE AMP
Chlamydia trachomatis, NAA: NEGATIVE
Neisseria Gonorrhoeae by PCR: NEGATIVE

## 2020-01-08 LAB — URINE CULTURE: Organism ID, Bacteria: NO GROWTH

## 2020-04-25 ENCOUNTER — Ambulatory Visit: Payer: Self-pay | Admitting: Internal Medicine

## 2020-04-25 ENCOUNTER — Other Ambulatory Visit: Payer: Self-pay

## 2020-04-25 ENCOUNTER — Encounter: Payer: Self-pay | Admitting: Internal Medicine

## 2020-04-25 VITALS — BP 98/60 | HR 78 | Resp 12 | Ht 59.0 in | Wt 121.0 lb

## 2020-04-25 DIAGNOSIS — R102 Pelvic and perineal pain: Secondary | ICD-10-CM

## 2020-04-25 DIAGNOSIS — Z3A15 15 weeks gestation of pregnancy: Secondary | ICD-10-CM

## 2020-04-25 LAB — POCT URINALYSIS DIPSTICK
Bilirubin, UA: NEGATIVE
Blood, UA: NEGATIVE
Glucose, UA: NEGATIVE
Ketones, UA: NEGATIVE
Leukocytes, UA: NEGATIVE
Nitrite, UA: NEGATIVE
Protein, UA: NEGATIVE
Spec Grav, UA: 1.01 (ref 1.010–1.025)
Urobilinogen, UA: 0.2 E.U./dL
pH, UA: 8.5 — AB (ref 5.0–8.0)

## 2020-04-25 LAB — POCT URINE PREGNANCY: Preg Test, Ur: POSITIVE — AB

## 2020-04-25 MED ORDER — PRENATAL VITAMINS 28-0.8 MG PO TABS: ORAL_TABLET | ORAL | 11 refills | Status: DC

## 2020-04-25 NOTE — Progress Notes (Signed)
    Subjective:    Patient ID: Tabitha [name], female   DOB: 1981-04-27, 39 y.o.   MRN: [id number]   HPI   Feels she is pregnant.  Stopped Amitriptyline on January 25th.  LMP was January 8th.  Patient had promised to stop using the Amitriptyline when she and her husband stopped practicing protection with regular use of condoms.   She thinks they started having unprotected sex at beginning of February.   Dates place her at about 45 and 5/[redacted] weeks gestation.   Started folic acid 1 mg daily about year ago.   EDC 10/14/2020  Started having LLQ pain 2 days ago.  Describes a needle pinching pain.  Feels swollen across her left pelvic area to suprapubic area.  Hurts when she tries to walk--points more so to suprapubic area.  She is urinating more frequently.  No burning.  No vaginal discharge or bleeding.  No fever.  Nausea also when it is time to eat with strong smells from cooking.  Also in morning.  No vomiting and has gained about 5 lbs from baseline.  Current Meds  Medication Sig  . Calcium Carbonate-Vit D-Min (CALCIUM 1200 PO) Take by mouth. 1 daily  . folic acid (FOLVITE) 1 MG tablet Take 1 tablet (1 mg total) by mouth daily.   No Known Allergies   Review of Systems    Objective:   BP 98/60 (BP Location: Left Arm, Patient Position: Sitting, Cuff Size: Normal)   Pulse 78   Resp 12   Ht 4\' 11"  (1.499 m)   Wt 121 lb (54.9 kg)   LMP 02/07/2020   BMI 24.44 kg/m   Physical Exam  NAD Lungs:  CTA CV:  RRR, no tachycardia. Abd:  S, + BS, No HSM or mass.  Tender mildly in LLQ, suprapubic and mildly to RLQ areas.  No rebound or peritoneal signs.  Uterine fundus palpated about 2 cm below umbilicus consistent with a pregnancy beyond first trimester.   Assessment & Plan   Likely 15 and 5/[redacted] week gestation IUP as dates suggest.  Patient does not appear to be in significant distress.  Discomfort likely from changes associated with pregnancy/ligamentous stretching, etc.   To go  to ED if worsens or vaginal bleeding. UA normal Urine HCG + Checking CBC, CMP. Letter for confirmation of pregnancy

## 2020-04-26 LAB — COMPREHENSIVE METABOLIC PANEL
ALT: 32 IU/L (ref 0–32)
AST: 31 IU/L (ref 0–40)
Albumin/Globulin Ratio: 1.5 (ref 1.2–2.2)
Albumin: 4.1 g/dL (ref 3.8–4.8)
Alkaline Phosphatase: 55 IU/L (ref 39–117)
BUN/Creatinine Ratio: 14 (ref 9–23)
BUN: 8 mg/dL (ref 6–20)
Bilirubin Total: <0.2 mg/dL (ref 0.0–1.2)
CO2: 21 mmol/L (ref 20–29)
Calcium: 9.1 mg/dL (ref 8.7–10.2)
Chloride: 102 mmol/L (ref 96–106)
Creatinine, Ser: 0.59 mg/dL (ref 0.57–1.00)
GFR calc Af Amer: 134 mL/min/{1.73_m2} (ref >59)
GFR calc non Af Amer: 116 mL/min/{1.73_m2} (ref >59)
Globulin, Total: 2.7 g/dL (ref 1.5–4.5)
Glucose: 74 mg/dL (ref 65–99)
Potassium: 4.2 mmol/L (ref 3.5–5.2)
Sodium: 138 mmol/L (ref 134–144)
Total Protein: 6.8 g/dL (ref 6.0–8.5)

## 2020-04-26 LAB — CBC WITH DIFFERENTIAL/PLATELET
Basophils Absolute: 0 10*3/uL (ref 0.0–0.2)
Basos: 1 %
EOS (ABSOLUTE): 0 10*3/uL (ref 0.0–0.4)
Eos: 0 %
Hematocrit: 36.1 % (ref 34.0–46.6)
Hemoglobin: 11.9 g/dL (ref 11.1–15.9)
Immature Grans (Abs): 0 10*3/uL (ref 0.0–0.1)
Immature Granulocytes: 1 %
Lymphocytes Absolute: 1.7 10*3/uL (ref 0.7–3.1)
Lymphs: 22 %
MCH: 31 pg (ref 26.6–33.0)
MCHC: 33 g/dL (ref 31.5–35.7)
MCV: 94 fL (ref 79–97)
Monocytes Absolute: 0.4 10*3/uL (ref 0.1–0.9)
Monocytes: 5 %
Neutrophils Absolute: 5.8 10*3/uL (ref 1.4–7.0)
Neutrophils: 71 %
Platelets: 252 10*3/uL (ref 150–450)
RBC: 3.84 x10E6/uL (ref 3.77–5.28)
RDW: 13.3 % (ref 11.7–15.4)
WBC: 8.1 10*3/uL (ref 3.4–10.8)

## 2020-05-03 ENCOUNTER — Encounter: Payer: Self-pay | Admitting: General Practice

## 2020-05-30 ENCOUNTER — Ambulatory Visit (INDEPENDENT_AMBULATORY_CARE_PROVIDER_SITE_OTHER): Payer: Self-pay | Admitting: *Deleted

## 2020-05-30 ENCOUNTER — Other Ambulatory Visit: Payer: Self-pay

## 2020-05-30 ENCOUNTER — Telehealth: Payer: Self-pay | Admitting: General Practice

## 2020-05-30 ENCOUNTER — Encounter: Payer: Self-pay | Admitting: General Practice

## 2020-05-30 VITALS — BP 93/55 | HR 71 | Temp 97.9°F | Wt 128.0 lb

## 2020-05-30 DIAGNOSIS — Z3400 Encounter for supervision of normal first pregnancy, unspecified trimester: Secondary | ICD-10-CM | POA: Insufficient documentation

## 2020-05-30 NOTE — Telephone Encounter (Signed)
Patient is scheduled for anatomy US on 06/05/2020 at 10:00am with [facility].

## 2020-05-30 NOTE — Telephone Encounter (Signed)
Charity application given to patient. 

## 2020-05-30 NOTE — Progress Notes (Signed)
° °  PRENATAL INTAKE SUMMARY  [name] presents today New OB Nurse Interview. Spanish Interpreter: Tabitha [name] ID: [id number]. OB History    Gravida  2   Para      Term      Preterm      AB  1   Living        SAB  1   TAB      Ectopic      Multiple      Live Births             I have reviewed the patient's medical, obstetrical, social, and family histories, medications, and available lab results.  SUBJECTIVE She has no unusual complaints  OBJECTIVE Initial nurse history/labs (New OB)  GENERAL APPEARANCE: alert, well appearing, in no apparent distress, oriented to person, place and time.  [employer].  EDD: 10/13/2020 by [facility] GA: [redacted]w[redacted]d [id number] FHT: 150   ASSESSMENT Normal pregnancy  PLAN Prenatal care-[facility] Pnl/HIV/Hep C OB Urine Culture GC/CT/PAP at next visit with Tabitha [name], CNM 06/22/2020 HgbEval/SMA/CF ([facility]) Panorama Continue PNV Ultrasound ordered for anatomy/confirm dating at [facility] 06/05/20  Clovis Pu, RN

## 2020-05-30 NOTE — Patient Instructions (Addendum)
Estudios genticos Secretary/administrator During Pregnancy Los estudios genticos que se realizan durante el embarazo tambin se denominan pruebas genticas prenatales. Este tipo de pruebas puede determinar si el beb est en riesgo de nacer con un trastorno causado por genes o cromosomas anormales (trastorno gentico). Los cromosomas contienen los genes que controlan cmo se desarrollar el beb en el tero. Hay muchos trastornos genticos diferentes. Algunos ejemplos de trastornos genticos que pueden hallarse a travs de estudios genticos son el sndrome de Down y la fibrosis [place]. Los cambios genticos (mutaciones) pueden transmitirse de padres a hijos. Los [employer] genticos se ofrecen a todas las mujeres antes del embarazo o durante su transcurso. Cada mujer puede elegir si desea Ameren Corporation genticos. Por qu se hacen los estudios genticos? Los estudios genticos se Heritage manager para averiguar si el nio est en riesgo de tener un trastorno gentico. International aid/development worker estudios genticos le permite:  Chiropractor los resultados de los estudios y sus opciones con un asesor gentico.  Prepararse para un beb que podra nacer con un trastorno gentico. Aprender acerca del trastorno con anticipacin le ayuda a estar mejor preparada para manejarlo. Sus mdicos tambin pueden estar preparados en caso de que el beb necesite cuidados especiales antes o despus del nacimiento.  Considerar si desea continuar con el embarazo. En algunos casos, los estudios genticos pueden realizarse para obtener informacin United Stationers rasgos que el nio heredar. Tipos de estudios genticos ONEOK tipos bsicos de estudios genticos. Las pruebas de deteccin sistemticas indican si el beb en gestacin (feto) tiene un riesgo ms alto de tener un trastorno gentico. Las pruebas de diagnstico evalan las clulas fetales reales para diagnosticar un trastorno gentico. Engineer, manufacturing de  deteccin sistemticas     Las pruebas de deteccin sistemticas no le hacen dao al beb. Se recomiendan a todas las mujeres embarazadas. Entre los tipos de pruebas de deteccin sistemticas se incluyen:  Prueba de deteccin de portadores. Este estudio implica analizar genes de ambos padres con muestras de su sangre o saliva. El [place] se [place] para determinar si los padres son portadores de una mutacin gentica que puede transmitirse al beb. En la [employer]ines, ambos padres deben ser portadores de la mutacin para que el beb est en riesgo.  Prueba de deteccin del primer trimestre. Esta prueba [place] un anlisis de sangre con un estudio de diagnstico por imgenes con ondas de sonido del beb (ecografa fetal). Esta prueba permite detectar si existe riesgo de que el beb tenga sndrome de Down u otras anomalas debido a tener cromosomas extra. Tambin comprueba si existen anomalas en el corazn, el abdomen o el esqueleto.  La prueba de deteccin del segundo trimestre tambin [place] un anlisis de sangre con una ecografa fetal. Se evala el riesgo de que el beb tenga anomalas genticas en la cara, el cerebro, la columna vertebral, el corazn o las extremidades.  Pruebas de deteccin sistemtica combinadas o secuenciales. Este tipo de estudios [employer] de las pruebas de Airline pilot del Engineer, maintenance trimestre y [place]. Este tipo de estudio puede ser ms preciso que las pruebas de deteccin del Engineer, maintenance o del segundo trimestre solas.  Pruebas de ADN fetal en clulas libres. Es un anlisis de sangre que detecta clulas liberadas por la placenta que penetran en la sangre de la [place]. Se puede utilizar para determinar si hay un riesgo de que el beb tenga sndrome de Down, otros sndromes con cromosomas extra o trastornos ocasionados  por nmeros anormales de cromosomas sexuales. Este anlisis puede realizarse en cualquier momento despus de las 10 semanas de [place].  Pruebas de  diagnstico Las pruebas de diagnstico conllevan leves riesgos de complicaciones, [place], infeccin y prdida del Psychiatrist. Estos anlisis se realizan solo si el beb tiene riesgo de presentar un trastorno gentico. Puede reunirse con un asesor gentico para hablar sobre los riesgos y beneficios antes de que le realicen pruebas de diagnstico. Algunos ejemplos de pruebas de diagnstico son los siguientes:  Muestras de vellosidades corinicas (MVC). Es un procedimiento en el que se extrae y Sandi Carne de clulas de la placenta. El procedimiento se puede realizar [place] 10 y 12 semanas de [place].  Amniocentesis. Es un procedimiento en el que se extrae y Sandi Carne del lquido amnitico y las clulas del saco que rodea el beb en gestacin. El procedimiento se puede realizar [place] 15 y 20 semanas de [place]. Qu significan los resultados? En el caso de una prueba de deteccin sistemtica:  [employer] son negativos, con frecuencia significa que el nio no tiene un Museum/gallery curator. An existe una pequea probabilidad de que el nio pueda tener un trastorno gentico.  [employer] son positivos, no significa que el nio tendr un trastorno gentico. Puede indicar que el nio tiene un riesgo ms alto de lo normal de tener un trastorno gentico. En ese caso, es posible que desee hablar con un asesor gentico sobre si debera realizarse pruebas genticas de diagnstico. En el caso de una prueba de diagnstico:  Si el resultado es negativo, es poco probable que el nio tenga un trastorno gentico.  Si el resultado de la prueba es positivo para un trastorno gentico, es probable que el nio tenga el trastorno. La prueba puede no indicar cul es la gravedad del trastorno. Converse con el mdico acerca de sus opciones. Preguntas para hacerle al mdico Antes de hablar con el mdico acerca de los estudios genticos, averige si hay antecedentes de trastornos genticos  en su familia. Tambin puede ser de [employer]PER[employer] orgenes tnicos de su familia. Luego, hgale al mdico las siguientes preguntas:  Mi beb est en riesgo de tener un trastorno gentico?  Cules son los beneficios de las pruebas de deteccin sistemticas genticas?  Qu estudios son los ms adecuados para m y mi beb?  Cules son los riesgos de cada estudio o prueba?  Si obtengo un resultado positivo en una prueba de deteccin sistemtica, cul es el prximo paso?  Debo reunirme con un asesor gentico antes de someterme a una prueba de diagnstico?  Mi pareja u otros miembros de mi familia deben [employer] Corporation estudios?  Cul es el costo de los estudios? Mi seguro mdico cubre los estudios? Resumen  Los estudios genticos se hacen durante el embarazo para averiguar si el nio est en riesgo de tener un trastorno gentico.  Los estudios genticos se ofrecen a todas las mujeres antes del embarazo o durante su transcurso. Cada mujer puede elegir si desea [employer] Corporation genticos.  Hay dos tipos bsicos de estudios genticos. Las pruebas de deteccin sistemticas indican si el beb en gestacin (feto) tiene un riesgo ms alto de tener un trastorno gentico. Las pruebas de diagnstico evalan las clulas fetales reales para diagnosticar un trastorno gentico.  Si el resultado de una prueba gentica de diagnstico es positivo, hable con su mdico acerca de sus opciones. Esta informacin no tiene Theme park manager el consejo del mdico. Manufacturing engineer  de hacerle al mdico cualquier pregunta que tenga. Document Revised: 04/22/2018 Document Reviewed: 04/22/2018 Elsevier Patient Education  2020 ArvinMeritorElsevier Inc.  [place]oun[place]villeSegundo trimestre de Psychiatristembarazo Second Trimester of Pregnancy  El segundo trimestre va desde la semana14 hasta la 27 (desde el mes 4 hasta el 6). Este suele ser el momento en el que mejor se siente. En general, las nuseas matutinas han disminuido o han desaparecido  completamente. Tendr ms energa y podr aumentarle el apetito. El beb en gestacin se desarrolla rpidamente. Hacia el final del sexto mes, el beb mide aproximadamente 9 pulgadas (23 cm) y pesa alrededor de 1 libras (700 g). Es probable que sienta al beb 3M Companymoverse entre las 18 y 20 semanas del Stro[place] Millsembarazo. Siga estas indicaciones en su casa: Medicamentos  Baxter Internationalome los medicamentos de venta libre y los recetados solamente como se lo haya indicado el mdico. Algunos medicamentos son seguros para tomar durante el Psychiatristembarazo y otros no lo son.  Tome vitaminas prenatales que contengan por lo menos 600microgramos (?g) de cido flico.  Si tiene dificultad para mover el intestino (estreimiento), tome un medicamento para ablandar las heces (laxante) si su mdico se lo autoriza. Comida y bebida   Ingiera alimentos saludables de Robert Leemanera regular.  No coma carne cruda ni quesos sin cocinar.  Si obtiene poca cantidad de calcio de los alimentos que ingiere, consulte a su mdico sobre la posibilidad de tomar un suplemento diario de calcio.  Evite el consumo de alimentos ricos en grasas y azcares, como los alimentos fritos y los dulces.  Si tiene Programme researcher, broadcasting/film/videomalestar estomacal (nuseas) o devuelve (vomita): ? Ingiera 4 o 5comidas pequeas por Geophysical data processorda en lugar de 3abundantes. ? Intente comer algunas galletitas saladas. ? Beba lquidos Altria Groupentre las comidas, en lugar de [place] Scientifichacerlo durante estas.  Para evitar el estreimiento: ? Consuma alimentos ricos en fibra, como frutas y verduras frescas, cereales integrales y frijoles. ? Beba suficiente lquido para mantener el pis (orina) claro o de color amarillo plido. Actividad  Haga ejercicios solamente como se lo haya indicado el mdico. Interrumpa la actividad fsica si comienza a tener calambres.  No haga ejercicio si hace demasiado calor, hay demasiada humedad o se encuentra en un lugar de mucha altura (altitud alta).  Evite levantar pesos Fortune Brandsexcesivos.  Use zapatos con  tacones bajos. Mantenga una buena postura al sentarse y pararse.  Puede continuar teniendo The St. Paul Travelersrelaciones sexuales, a menos que el mdico le indique lo contrario. Alivio del dolor y del Dentistmalestar  Use un sostn que le brinde buen soporte si sus mamas estn sensibles.  Dese baos de asiento con agua tibia para Engineer, materialsaliviar el dolor o las molestias causadas por las hemorroides. Use una crema para las hemorroides si el mdico la autoriza.  Descanse con las piernas elevadas si tiene calambres o dolor de cintura.  Si desarrolla venas hinchadas y abultadas (vrices) en las piernas: ? Use medias de compresin o medias de descanso como se lo haya indicado el mdico. ? Levante (eleve) los pies durante 15minutos, 3 o 4veces por Futures traderda. ? Limite el consumo de sal en sus alimentos. Cuidado prenatal  TongaEscriba sus preguntas. Llvelas cuando concurra a las visitas prenatales.  Concurra a todas las visitas prenatales como se lo haya indicado el mdico. Esto es importante. Seguridad  Mellon FinancialColquese el cinturn de seguridad cuando conduzca.  Haga una lista de los nmeros de telfono de Associate Professoremergencia, que W. R. Berkleyincluya los nmeros de telfono de familiares, amigos, [place], as como los departamentos de polica y bomberos. Instrucciones generales  Consulte a  su mdico sobre los ConocoPhillips debe comer o pdale que la ayude a Clinical research associate a quien pueda aconsejarla si necesita ese servicio.  Consulte a su mdico acerca de dnde se dictan clases prenatales cerca de donde vive. Comience las clases antes del mes 6 de embarazo.  No se d baos de inmersin en agua caliente, baos turcos ni saunas.  No se haga duchas vaginales ni use tampones o toallas higinicas perfumadas.  No mantenga las piernas cruzadas durante [place].  Vaya al dentista si an no lo hizo. Use un cepillo de cerdas suaves para cepillarse los dientes. Psese el hilo dental suavemente.  No fume, no consuma hierbas ni beba alcohol. No tome frmacos que  el mdico no haya autorizado.  No consuma ningn producto que contenga nicotina o tabaco, como cigarrillos y Administrator, Civil Service. Si necesita ayuda para dejar de fumar, consulte al mdico.  Evite el contacto con las bandejas sanitarias de los gatos y la tierra que estos animales usan. Estos elementos contienen bacterias que pueden causar defectos congnitos al beb y la posible prdida del beb (aborto espontneo) o la muerte fetal. Comunquese con un mdico si:  Tiene clicos leves o siente presin en la parte baja del vientre.  Tiene dolor al hacer pis (orinar).  Advierte un lquido con olor ftido que proviene de la vagina.  Tiene Programme researcher, broadcasting/film/video (nuseas), devuelve (vomita) o tiene deposiciones acuosas (diarrea).  Sufre un dolor persistente en el abdomen.  Siente mareos. Solicite ayuda de inmediato si:  Tiene fiebre.  Tiene una prdida de lquido por la vagina.  Tiene sangrado o pequeas prdidas vaginales.  Siente dolor intenso o clicos en el abdomen.  Sube o baja de peso rpidamente.  Tiene dificultades para recuperar el aliento y siente dolor en el pecho.  Sbitamente se le hinchan mucho el rostro, las [place], los tobillos, los pies o las piernas.  No ha sentido los movimientos del beb durante Georgianne Fick.  Siente un dolor de cabeza intenso que no se alivia al tomar [employer].  Tiene dificultad para ver. Resumen  El segundo trimestre va desde la semana14 hasta la 27, desde el mes 4 hasta el 6. Este suele ser el momento en el que mejor se siente.  Para cuidarse y cuidar a su beb en gestacin, debe comer alimentos saludables, tomar medicamentos solamente si su mdico le indica que lo haga y hacer actividades que sean seguras para usted y su beb.  Llame al mdico si se enferma o si nota algo inusual acerca de su embarazo. Tambin llame al mdico si necesita ayuda para saber qu alimentos debe comer o si quiere saber qu actividades puede realizar de forma  segura. Esta informacin no tiene Theme park manager el consejo del mdico. Asegrese de hacerle al mdico cualquier pregunta que tenga. Document Revised: 09/10/2017 Document Reviewed: 09/10/2017 Elsevier Patient Education  2020 ArvinMeritor.  Dolor del ligamento redondo Round Ligament Pain  El ligamento redondo es un cordn de msculo y tejido que sirve de sostn para Careers information officer. Puede volverse una fuente de dolor durante el embarazo si se distiende o se torsiona a medida que el beb crece. Generalmente, el dolor Cendant Corporation trimestre (semanas 13 a 28) de Denym Christenberry, y Software engineer y Landscape architect el momento del [place]. No se trata de un problema grave y no es perjudicial para el beb. El dolor del ligamento redondo suele ser agudo y punzante, y durar poco tiempo, pero tambin puede ser sordo, persistente y continuo.  Se lo percibe en la regin inferior del abdomen o en la ingle. A menudo comienza en la zona ms profunda de la ingle y se extiende hacia regin externa de la cadera. El dolor puede producirse cuando usted:  Cambia sbitamente de posicin, como pasar rpidamente de estar sentada a ponerse de pie.  Se da vuelta en la cama.  Tose o estornuda.  Hace actividad fsica. Siga estas indicaciones en su casa:   Controle su afeccin para detectar cualquier cambio.  Cuando el dolor comience, reljese. Luego pruebe cualquiera de estos mtodos para aliviar el dolor: ? Psychologist, counselling. ? Flexionar las rodillas hacia el abdomen. ? Acostarse de costado con una almohada debajo del abdomen y [employer] las piernas. ? Sentarse en una baera con agua tibia durante 15 a o hasta que el dolor desaparezca.  Tome los medicamentos de venta libre y los recetados solamente como se lo haya indicado el mdico.  Muvase lentamente cuando se siente o se ponga de pie.  No haga caminatas largas si le generan dolor.  Suspenda o reduzca las actividades fsicas si Civil Service fast streamer.  Concurra a todas las visitas de control como se lo haya indicado el mdico. Esto es importante. Comunquese con un mdico si:  El dolor no desaparece con Scientist, research (medical).  Tiene un dolor en la espalda que no tena antes.  El medicamento no resulta eficaz. Solicite ayuda inmediatamente si:  Tiene fiebre o escalofros.  Tiene contracciones uterinas.  Tiene una hemorragia vaginal abundante.  Tiene nuseas o vmitos.  Tiene diarrea.  Siente dolor al ConocoPhillips. Resumen  El dolor del ligamento redondo se siente en la parte inferior del abdomen o la ingle. Generalmente es un dolor agudo y punzante, y dura poco tiempo. Tambin puede ser un dolor sordo, persistente y continuo.  Este dolor por lo general empieza en el segundo trimestre (semanas 13 a 28). Se produce porque el tero se estira a medida que el beb crece, y no es perjudicial para el beb.  Usted puede notar el dolor cuando cambia sbitamente de posicin, cuando toce o estornuda, o durante la actividad fsica.  Relajarse, flexionar las rodillas hacia el abdomen, acostarse sobre un lado o tomar un bao de agua tibia pueden ayudar a Engineer, site.  Solicite ayuda a su mdico si el dolor no desaparece o si tiene hemorragia vaginal, nuseas, vmitos, diarrea o dolor al ConocoPhillips. Esta informacin no tiene Theme park manager el consejo del mdico. Asegrese de hacerle al mdico cualquier pregunta que tenga. Document Revised: 07/30/2018 Document Reviewed: 07/30/2018 Elsevier Patient Education  2020 ArvinMeritor.  Signos de advertencia durante el embarazo Warning Signs During Pregnancy Generalmente, el embarazo dura unas 40 semanas, a partir del Social worker del ltimo perodo hasta que el beb nace. Se divide en tres fases llamadas trimestres.  El Engineer, maintenance trimestre se refiere a Lawyer 1 [employer] la semana 13 de [place].  El segundo trimestre es el comienzo de la semana 14 hasta el final de la semana 27.  El tercer  trimestre es el comienzo de la semana 28 hasta el nacimiento del beb. Durante cada trimestre de embarazo, ciertos signos y sntomas pueden indicar un problema. Hable con su mdico acerca de su actual estado de salud y cualquier afeccin mdica que tenga. Asegrese de [employer] a los que debera estar atenta e informar. De qu modo la afecta a usted?  Signos de Airline pilot trimestre de embarazo Si bien algunos cambios durante  el primer trimestre pueden ser incmodos, la mayora no representa un problema grave. Informe al mdico si tiene alguno de los siguientes signos de advertencia durante Financial risk analyst trimestre:  No puede comer ni beber sin vomitar, y esto se prolonga durante ms de Civil engineer, contracting.  Tiene sangrado o manchado vaginal junto con clicos parecidos a los de Tax adviser.  Tiene diarrea durante ms de Civil engineer, contracting.  Tiene fiebre u otros signos de infeccin, como: ? Engineer, mining o ardor al Geographical information systems officer. ? Olor ftido o secrecin vaginal espesa o amarillenta. Signos de advertencia en el segundo trimestre de embarazo A medida que el beb crece y cambia durante el segundo trimestre, hay otros signos y sntomas que pueden indicar un problema. Estos incluyen los siguientes:  Signos y sntomas de infeccin, incluyendo [place].  Signos o sntomas de un aborto espontneo o un parto prematuro, por ejemplo, contracciones regulares, clicos parecidos a los de la menstruacin o dolor en la parte inferior del abdomen.  Secrecin vaginal acuosa o con sangre o sangrado vaginal obvio.  Sentir que el corazn late [place].  Dificultad para respirar.  Nuseas, vmitos o diarrea que dura ms de Civil engineer, contracting.  Deseo compulsivo de comer cosas que no son alimentos, tales como arcilla, tiza o [place]. Esto puede ser un signo de una afeccin mdica muy tratable llamada pica. Ms adelante, en el segundo trimestre, observe si tiene signos y sntomas de una enfermedad grave denominada preeclampsia.Estos  incluyen los siguientes:  Cambios en la visin.  Un dolor de cabeza intenso que no se [place].  Nuseas y vmitos. Tambin es importante observar si el beb deja de moverse o se mueve menos que lo normal durante este trimestre. Signos de Radiographer, therapeutic trimestre de embarazo A medida que se acerca el tercer trimestre del embarazo, el beb crece y su cuerpo se prepara para el nacimiento. En el tercer trimestre, asegrese de informarle a su mdico si:  Tiene signos y sntomas de infeccin, incluyendo fiebre.  Tiene una hemorragia vaginal abundante.  Nota que su beb se mueve menos que lo habitual o no se mueve.  Tiene nuseas, vmitos o diarrea que dura ms de Civil engineer, contracting.  Siente un dolor de cabeza intenso que no se [place].  Tiene cambios en la visin, como ver manchas o tener visin borrosa o ver doble.  Aumenta la hinchazn en sus manos o rostro. De qu modo lo afecta al beb? Durante todo el [place], siempre informe cualquier signo de advertencia de un problema al mdico. Esto puede ayudar a prevenir las complicaciones que pueden afectar a su beb, por ejemplo:  Aumento del riesgo de nacimiento prematuro.  Infecciones que pueden transmitirse al beb.  Aumento del riesgo de muerte fetal. Comunquese con un mdico si:  Tiene cualquier signo de advertencia de un problema para el actual trimestre de su embarazo.  Cualquiera de lo siguiente se aplica a usted durante cualquier trimestre del embarazo: ? Tiene emociones fuertes, como tristeza o ansiedad, que interfieren con el trabajo o las relaciones personales. ? Se siente insegura en su casa y necesita ayuda para encontrar un lugar seguro para vivir. ? [place] productos de tabaco, alcohol o drogas y [place] para dejar de hacerlo. Solicite ayuda de inmediato si: Tiene signos o sntomas de trabajo de parto antes de las 37 semanas de [place]. Estos incluyen los siguientes:  Contracciones separadas unas de otras por  intervalos de 5 minutos o menos, o que aumentan en frecuencia, intensidad o duracin.  Dolor abdominal  sbito y agudo o Fish farm manager.  Chorro repentino o goteo constante de lquido proveniente de la vagina. Resumen  Generalmente, el embarazo dura unas 40 semanas, a partir del Passenger transport manager del ltimo perodo hasta que el beb nace. Se divide en tres fases llamadas trimestres. Cada trimestre tiene signos de advertencia a los que debe estar atenta.  Siempre informe cualquier signo de advertencia a su mdico para evitar las complicaciones que pueden afectar tanto a usted como a su beb.  Hable con su mdico acerca de su actual estado de salud y cualquier afeccin mdica que tenga. Asegrese de [employer] a los que debera estar atenta e informar. Esta informacin no tiene Marine scientist el consejo del mdico. Asegrese de hacerle al mdico cualquier pregunta que tenga. Document Revised: 12/31/2017 Document Reviewed: 12/31/2017 Elsevier Patient Education  2020 [place] y pruebas de deteccin sistemticas durante Water quality scientist Tests and Screening During Pregnancy Realizarse ciertos estudios y Building services engineer de deteccin sistemticas durante el embarazo es una parte importante del cuidado prenatal. Estos estudios ayudan al mdico a Hydrographic surveyor problemas que podran Youth worker. Algunos estudios se realizan en todas las [place] y otros son opcionales. La mayora de los estudios y pruebas de deteccin sistemticas no representan ningn riesgo para usted ni para su beb. Es posible que necesite estudios adicionales si [place] de las pruebas de rutina indica que existe un problema. Estudios y Building services engineer de deteccin sistemticas realizados al inicio del embarazo Algunos estudios y pruebas de deteccin sistemticas que puede esperar que le realicen al principio del embarazo incluyen:  Anlisis de [place], tales como: ? Hemograma completo (HC). Este anlisis se realiza para  controlar los glbulos blancos y rojos. Puede ayudar a identificar un riesgo de anemia, una infeccin o sangrado. ? Determinacin del grupo sanguneo. Este anlisis determina el grupo sanguneo, as como si tiene una determinada protena en los glbulos rojos (factor Rh). Si no tiene esta protena (Rh negativo) y su beb s la tuviera (Rh positivo), su cuerpo podra producir anticuerpos contra el factor Rh. Esto podra ser peligroso para la salud del beb. ? Estudios para Engineer, building services que pueden causar defectos congnitos o que pueden transmitirse al beb, tales como:  [place]. La prueba indica si es inmune a la rubola.  Hepatitis B y C. A todas las mujeres se les realiza el anlisis para detectar el virus de la hepatitis B. Es posible que tambin se le realice al anlisis de la hepatitis C si tiene factores de riesgo de Air traffic controller.  Infeccin por el virus de [place]. Es posible que le hagan un anlisis de sangre o de orina para Hydrographic surveyor la presencia de esta infeccin si usted o su pareja ha viajado a una zona donde existe virus.  Anlisis de [place]. Truddie Coco de orina se puede analizar para detectar diabetes, protenas en la orina y signos de infeccin.  Anlisis para detectar infecciones de transmisin sexual (ITS), como VIH, sfilis y clamidia.  Anlisis de tuberculosis. Es posible que le hagan esta prueba cutnea si est en riesgo de tener tuberculosis.  Ecografa fetal. Es un estudio de diagnstico por imgenes del beb en gestacin. Para realizarlo, se usan ondas de sonido y [place] computadora. Este estudio puede realizarse [place] 11 y 70 semanas para confirmar el Media planner y Air traffic controller a Contractor de [place]. Estudios y Building services engineer de deteccin sistemticas realizados al Research officer, trade union Ciertos estudios se realizan por primera vez cuando el embarazo ya est  ms avanzado. Adems, se repiten algunos de los [employer] se hicieron al inicio del Psychiatrist. Algunos de los  estudios habituales que puede esperar ms adelante en el embarazo incluyen:  Anlisis de anticuerpos Rh. Si usted tiene sangre Rh negativo, le harn un anlisis de sangre aproximadamente a las 28 semanas de embarazo para ver si est produciendo anticuerpos Rh. Si no ha comenzado a producir anticuerpos, Theatre stage manager inyeccin para evitar que produzca anticuerpos durante el resto del [place].  Anlisis de glucosa. Este anlisis mide el nivel de azcar en la sangre para averiguar si est desarrollando el tipo de diabetes que se produce durante el embarazo (diabetes gestacional). Pueden hacerle este anlisis antes si tiene factores de riesgo de tener diabetes.  Anlisis de deteccin de estreptococos del grupo B (EGB). Los EGB son un tipo de bacterias que pueden vivir en el recto o la vagina. Es posible tener EGB y no presentar ningn sntoma. Los EGB pueden contagiarse al beb durante el parto. Este anlisis consiste en realizar un hisopado rectal y vaginal cuando transcurrieron entre 35 y 37 semanas de Psychiatrist. Si el resultado del anlisis es positivo para el EGB, puede tratarse con antibiticos.  Hemograma completo para detectar la presencia de anemia y capacidad de coagulacin.  Anlisis de orina para Landscape architect presencia de protena, que puede ser un signo de una afeccin llamada preeclampsia.  Ecografa fetal. Esta puede repetirse [employer] 16 y 20 del embarazo para comprobar cmo crece y se desarrolla el beb. Estudios de deteccin de defectos congnitos Algunos defectos congnitos pueden deberse a la presencia de genes anormales que son hereditarios. Al comienzo del Psychiatrist, pueden hacerse estudios para determinar si el beb est en riesgo de tener un trastorno gentico. Estos estudios son opcionales. El tipo de estudios recomendados para usted depender de sus antecedentes clnicos y familiares, su origen tnico y su edad. Los estudios pueden incluir lo siguiente:  Pruebas de  deteccin sistemticas. Estas pruebas pueden incluir [place], anlisis de [place] o [place] combinacin de [place]. Los anlisis de sangre se realizan para Landscape architect presencia de genes anormales y la ecografa se realiza para Engineer, manufacturing defectos congnitos de manera temprana.  Prueba de deteccin de portadores. Este estudio implica analizar muestras de sangre o de saliva de ambos padres para determinar si son portadores de genes anormales que podran transmitir al beb. Si las pruebas de deteccin sistemticas genticas revelan que el beb est en riesgo de tener un defecto gentico, posiblemente se recomienden pruebas de diagnstico adicionales, por ejemplo:  Amniocentesis. Implica analizar Lauris Poag de lquido del tero (lquido amnitico).  Muestras de vellosidades corinicas. En [employer], se analiza [place] de clulas de la placenta para detectar si hay clulas anormales. A diferencia de otros estudios que se realizan durante el [place], las pruebas de diagnstico s conllevan cierto riesgo para Firefighter. Hable con su mdico [employer] y beneficios de los estudios genticos. Dnde encontrar ms informacin  American Pregnancy Association (Asociacin Estadounidense del [place]): americanpregnancy.org/prenatal-testing  Office on [employer] (Oficina para la Salud de la Mujer): MightyReward.co.nz  March of Dimes: marchofdimes.org/pregnancy Preguntas para hacerle al mdico  Qu estudios de rutina son recomendables para m?  Cundo y de qu modo se realizarn estos estudios?  Cundo [employer] estudios de rutina?  Qu significan los resultados de estos estudios para m o mi beb?  Recomienda alguna prueba de deteccin sistemtica gentica? Cul o cules?  Debo consultar a un asesor  gentico antes de someterme a una prueba de deteccin gentica? Resumen  Realizarse estudios y pruebas de deteccin sistemticas durante el embarazo  es una parte importante del cuidado prenatal.  Al principio del Psychiatrist, pueden realizarse estudios para verificar el grupo sanguneo y [place] de anticuerpos Rh, adems de los riesgos de diversos trastornos que pueden afectar al beb.  La ecografa fetal puede realizarse a comienzos del embarazo para Production assistant, radio, y ms adelante para Engineer, manufacturing la presencia de cualquier defecto congnito.  Cuando el embarazo est ms avanzado, pueden incluirse anlisis de deteccin de estreptococos del grupo B (EGB) y de diabetes gestacional.  Los estudios genticos son opcionales. Considere la posibilidad de Science writer a un asesor gentico acerca de [employer]. Esta informacin no tiene Theme park manager el consejo del mdico. Asegrese de hacerle al mdico cualquier pregunta que tenga. Document Revised: 04/22/2018 Document Reviewed: 04/22/2018 Elsevier Patient Education  2020 [employer].

## 2020-05-31 LAB — CBC/D/PLT+RPR+RH+ABO+RUB AB...
Antibody Screen: NEGATIVE
Basophils Absolute: 0 10*3/uL (ref 0.0–0.2)
Basos: 0 %
EOS (ABSOLUTE): 0 10*3/uL (ref 0.0–0.4)
Eos: 0 %
HCV Ab: <0.1 s/co ratio (ref 0.0–0.9)
HIV Screen 4th Generation wRfx: NONREACTIVE
Hematocrit: 35.4 % (ref 34.0–46.6)
Hemoglobin: 12 g/dL (ref 11.1–15.9)
Hepatitis B Surface Ag: NEGATIVE
Immature Grans (Abs): 0 10*3/uL (ref 0.0–0.1)
Immature Granulocytes: 0 %
Lymphocytes Absolute: 1.4 10*3/uL (ref 0.7–3.1)
Lymphs: 19 %
MCH: 31.5 pg (ref 26.6–33.0)
MCHC: 33.9 g/dL (ref 31.5–35.7)
MCV: 93 fL (ref 79–97)
Monocytes Absolute: 0.3 10*3/uL (ref 0.1–0.9)
Monocytes: 4 %
Neutrophils Absolute: 5.5 10*3/uL (ref 1.4–7.0)
Neutrophils: 77 %
Platelets: 242 10*3/uL (ref 150–450)
RBC: 3.81 x10E6/uL (ref 3.77–5.28)
RDW: 12.6 % (ref 11.7–15.4)
RPR Ser Ql: NONREACTIVE
Rh Factor: POSITIVE
Rubella Antibodies, IGG: 11 index (ref >0.99)
WBC: 7.3 10*3/uL (ref 3.4–10.8)

## 2020-05-31 LAB — HCV INTERPRETATION

## 2020-06-01 LAB — URINE CULTURE, OB REFLEX

## 2020-06-01 LAB — CULTURE, OB URINE

## 2020-06-06 ENCOUNTER — Encounter: Payer: Self-pay | Admitting: General Practice

## 2020-06-07 ENCOUNTER — Encounter: Payer: Self-pay | Admitting: General Practice

## 2020-06-20 ENCOUNTER — Encounter: Payer: Self-pay | Admitting: General Practice

## 2020-06-22 ENCOUNTER — Encounter: Payer: Self-pay | Admitting: Obstetrics and Gynecology

## 2020-06-22 ENCOUNTER — Other Ambulatory Visit (HOSPITAL_COMMUNITY)
Admission: RE | Admit: 2020-06-22 | Discharge: 2020-06-22 | Disposition: A | Discharge status: Home / Self Care | Payer: Self-pay | Source: Ambulatory Visit | Attending: Obstetrics and Gynecology | Admitting: Obstetrics and Gynecology

## 2020-06-22 ENCOUNTER — Ambulatory Visit (INDEPENDENT_AMBULATORY_CARE_PROVIDER_SITE_OTHER): Payer: Self-pay | Admitting: Obstetrics and Gynecology

## 2020-06-22 ENCOUNTER — Other Ambulatory Visit: Payer: Self-pay

## 2020-06-22 VITALS — BP 98/60 | HR 80 | Wt 128.0 lb

## 2020-06-22 DIAGNOSIS — O26892 Other specified pregnancy related conditions, second trimester: Secondary | ICD-10-CM

## 2020-06-22 DIAGNOSIS — Z3400 Encounter for supervision of normal first pregnancy, unspecified trimester: Secondary | ICD-10-CM | POA: Insufficient documentation

## 2020-06-22 DIAGNOSIS — Z603 Acculturation difficulty: Secondary | ICD-10-CM

## 2020-06-22 DIAGNOSIS — O99619 Diseases of the digestive system complicating pregnancy, unspecified trimester: Secondary | ICD-10-CM

## 2020-06-22 DIAGNOSIS — O4400 Placenta previa specified as without hemorrhage, unspecified trimester: Secondary | ICD-10-CM

## 2020-06-22 DIAGNOSIS — Z789 Other specified health status: Secondary | ICD-10-CM

## 2020-06-22 DIAGNOSIS — O26899 Other specified pregnancy related conditions, unspecified trimester: Secondary | ICD-10-CM

## 2020-06-22 DIAGNOSIS — O0990 Supervision of high risk pregnancy, unspecified, unspecified trimester: Secondary | ICD-10-CM

## 2020-06-22 DIAGNOSIS — Z3A23 23 weeks gestation of pregnancy: Secondary | ICD-10-CM

## 2020-06-22 DIAGNOSIS — N949 Unspecified condition associated with female genital organs and menstrual cycle: Secondary | ICD-10-CM

## 2020-06-22 DIAGNOSIS — K219 Gastro-esophageal reflux disease without esophagitis: Secondary | ICD-10-CM

## 2020-06-22 DIAGNOSIS — R102 Pelvic and perineal pain: Secondary | ICD-10-CM

## 2020-06-22 DIAGNOSIS — O09522 Supervision of elderly multigravida, second trimester: Secondary | ICD-10-CM

## 2020-06-22 MED ORDER — COMFORT FIT MATERNITY SUPP SM MISC: 1.0000 [IU] | Freq: Every day | 0 refills | Status: DC | PRN

## 2020-06-22 MED ORDER — OMEPRAZOLE 40 MG PO CPDR: 40.0000 mg | DELAYED_RELEASE_CAPSULE | Freq: Every day | ORAL | 3 refills | Status: DC

## 2020-06-22 NOTE — Progress Notes (Signed)
Pt presents for ROB c/o upper gastric pain while eating and bilateral low abdominal pain while walking.

## 2020-06-22 NOTE — Patient Instructions (Addendum)
Placenta previa -- Sin relaciones sexuales (Placenta Previa) La placenta previa es un trastorno que padece una mujer embarazada cuando la placenta se implanta en la parte inferior del tero. La placenta cubre de manera parcial o total la abertura del cuello del tero. Es un problema, ya que el beb debe pasar a travs del cuello del tero durante el [place]. Existen tres tipos de placenta previa:  Placenta previa marginal. La placenta llega a una distancia de una pulgada (2,5cm) de la abertura del cuello del tero pero no la cubre.  Placenta previa parcial. La placenta cubre una parte de la abertura del cuello del tero.  Placenta previa completa. La placenta cubre toda la abertura del cuello del tero. Si la placenta previa es marginal o parcial y se diagnostica durante la primera mitad del [place], es posible que la placenta se desplace hacia una posicin normal mientras avanza el embarazo y no cubrir ms el cuello del tero. Es importante que cumpla con todos los controles prenatales para estar controlada de cerca por su mdico. CAUSAS Se desconoce la causa de este trastorno. FACTORES DE RIESGO Esta afeccin es ms probable en las mujeres que tienen estas caractersticas:  Est embarazada de ms de un beb (embarazo mltiple).  La forma de su tero no es normal.  Tiene cicatrices en la capa que recubre el tero.  Tuvo cirugas anteriores en el tero, como una cesrea.  Ya ha tenido un beb.  Tiene antecedentes de placenta previa.  Ha fumado o ingerido cocana Academic librarian.  Est embarazada y tiene 35aos o ms. SNTOMAS El sntoma principal de la placenta previa es un sangrado vaginal indoloro, sbito, durante la segunda mitad del [place]. La cantidad de sangrado puede ser muy poca al principio y [place], el sangrado generalmente desaparece por s solo. Tambin puede haber episodios de mucho sangrado. Es posible que algunas mujeres con placenta previa no sangren  nada. DIAGNSTICO  Esta afeccin se diagnostica mediante: ? Una ecografa. Este estudio [employer] las ondas sonoras para Contractor lugar donde est ubicada la placenta antes de que usted tenga algn episodio de sangrado. ? Un control despus de haber tenido sangrado vaginal.  Si le diagnostican placenta previa parcial o completa, generalmente se evitan los exmenes por tacto. Sin embargo, su Production manager un examen con el espculo.  Si nunca le hicieron Architectural technologist, es posible que la placenta previa no se diagnostique hasta que se produzca el sangrado durante el [place]. TRATAMIENTO El tratamiento de esta afeccin puede incluir lo siguiente:  Disminucin de la March ARB.  Hacer reposo en cama, en su casa o en el hospital.  Reposo plvico. No se debe introducir nada en la vagina durante el reposo plvico. Esto significa no mantener relaciones sexuales y no usar tampones ni hacerse duchas vaginales.  Una transfusin de sangre para reemplazar la sangre que haya perdido (prdida de [place]).  Parto por cesrea. Es necesaria si: ? La hemorragia vuelve y no se Magazine features editor. ? La placenta cubre completamente el cuello del tero.   Medicamentos para Estate manager/land agent prematuro o ayudar a Customer service manager los pulmones del beb. Este tratamiento se puede utilizar si el beb debe nacer antes de que el embarazo llegue a trmino. El Child psychotherapist de lo siguiente:  La cantidad de sangrado o si este se ha detenido.  En qu etapa se encuentra del embarazo.  El [place] del beb.  El tipo de placenta previa que tiene. INSTRUCCIONES PARA  EL CUIDADO EN EL HOGAR  Haga mucho reposo y limite las actividades como se lo haya indicado el mdico.  Haga reposo en cama durante todo el tiempo que le haya indicado el mdico.  No mantenga relaciones sexuales, no use tampones, no se haga duchas vaginales ni introduzca nada en su vagina si el mdico le recomienda  reposo plvico.  Tome los medicamentos recetados y de venta libre como se lo haya indicado el mdico.  Oceanographer a todas las visitas de control como se lo haya indicado el mdico. Esto es importante. SOLICITE ATENCIN MDICA DE INMEDIATO SI:  Tiene sangrado vaginal, aunque sea indoloro o en una pequea cantidad.  Tiene clicos o contracciones regulares.  Siente dolor en la parte inferior de la espalda o en el abdomen.  Tiene una sensacin de presin en aumento en la pelvis.  Elimina cada vez ms una mucosidad acuosa o con sangre por la vagina. Esta informacin no tiene Theme park manager el consejo del mdico. Asegrese de hacerle al mdico cualquier pregunta que tenga. Document Revised: 11/05/2016 Document Reviewed: 06/29/2016 Elsevier Patient Education  2020 [employer].  Prueba de tolerancia a la glucosa durante el embarazo Glucose Tolerance Test During Pregnancy Por qu me debo realizar esta prueba? La prueba de tolerancia a la glucosa (PTG) se realiza para Biomedical engineer en que el cuerpo procesa el azcar (glucosa). Esta es una de las diferentes pruebas que se usan para diagnosticar la diabetes que se desarrolla durante el embarazo (diabetes mellitus gestacional). La diabetes gestacional es una forma temporal de diabetes que algunas mujeres desarrollan durante el embarazo. Generalmente, se produce durante el segundo trimestre del embarazo y desaparece despus del parto. Los anlisis (pruebas de Airline pilot) para la diabetes gestacional por lo general se [employer] las 24 y 28 semanas de Psychiatrist. Se le podr realizar la prueba PTG despus de realizarse una prueba de deteccin de glucosa de 1 hora si los resultados de esa prueba indican que es posible que tenga diabetes gestacional. Tambin pueden hacerle esta prueba si:  Tiene antecedentes de diabetes gestacional.  Tiene antecedentes de haber parido bebs muy grandes o antecedentes de prdida fetal repetida (muerte  fetal).  Tiene signos y sntomas de diabetes, tales como: ? Cambios en la visin. ? Hormigueo o adormecimiento en las manos o los pies. ? Cambios en el hambre, la sed y la miccin que no se explican por Firefighter. Qu se analiza? Esta prueba mide la cantidad de glucosa en la sangre en diferentes momentos durante un perodo de 3horas. Esto indica qu tan bien su cuerpo puede procesar la glucosa. Qu tipo de [place] se toma?  Para esta prueba, se extraen muestras de sangre. Por lo general, para extraerlas, se introduce una aguja en un vaso sanguneo. Cmo debo prepararme para esta prueba?  Durante 3 das antes de la prueba, coma normalmente. Coma muchos alimentos con alto contenido de carbohidratos.  Siga las indicaciones del mdico acerca de lo siguiente: ? Restricciones en la comida o la bebida el da de la prueba. Se le podr pedir que no coma ni beba nada ms que agua (ayuno) desde 8 a 10 horas antes de la prueba. ? Cambiar o suspender los medicamentos que toma habitualmente. Algunos medicamentos pueden interferir en esta prueba. Informe al mdico acerca de lo siguiente:  Todos los Walt Disney, incluidos vitaminas, hierbas, gotas oftlmicas, cremas y [address] de [address].  Cualquier enfermedad de la sangre que tenga.  Cirugas a las que se someti.  Cualquier enfermedad que tenga. Qu ocurre durante la prueba? Primero se le medir la glucemia. Esto se denomina glucemia en ayunas, ya que usted hizo ayuno antes de la prueba. Luego beber [employer] solucin de glucosa que contiene una cierta cantidad de glucosa. Se le medir la glucosa en sangre nuevamente 1, 2 y 3 horas despus de beber la solucin. La realizacin de esta prueba lleva alrededor de 3 horas. Durante ese tiempo Chiropodist donde se realiza la prueba. Durante el perodo de la prueba:  No coma ni beba nada que no sea la solucin de glucosa.  No haga ejercicios.  No consuma ningn  producto que contenga nicotina o tabaco, como cigarrillos y Psychologist, sport and exercise. Si necesita ayuda para dejar estos productos, consulte a su mdico. El procedimiento de prueba puede variar segn el mdico y Photographer hospital. Cmo se informan los [place]? Sus resultados se informarn como miligramos de glucosa por decilitro de sangre (mg/dl) o milimoles por litro (mmol/l). El mdico comparar sus resultados con los rangos normales que se establecieron despus de Optometrist el anlisis a un grupo grande de personas (rangos de referencia). Los rangos de referencia pueden variar entre laboratorios y hospitales. Los rangos de referencia habituales para esta prueba son los siguientes:  En ayunas: menos de 95 a 105mg /dl (5,3 a 5,28mmol/l).  1 hora despus de beber glucosa: menos de 180 a 190mg /dl (10,0 a 10,11mmol/l).  2 horas despus de beber glucosa: menos de 155 a 165mg /dl (8,6 a 9,76mmol/l).  3 horas despus de beber glucosa: de 140 a 145mg /dl (7,8 a 8,80mmol/l). Sylva significan los resultados? Los [employer] de los rangos de Biomedical engineer se Facilities manager, lo que significa que sus niveles de glucosa estn bien controlados. Si dos o ms de sus niveles de glucemia estn altos, es posible que le diagnostiquen diabetes gestacional. Si solo un nivel est alto, el mdico podr sugerir repetir IT sales professional o hacer otras pruebas para Firefighter un diagnstico. Hable con el mdico sobre lo que significan los [place]. Preguntas para hacerle al mdico Consulte a su mdico o pregunte en el departamento donde se realiza la prueba acerca de lo siguiente:  Cundo estarn disponibles mis resultados?  Cmo obtendr mis resultados?  Cules son mis opciones de tratamiento?  Qu otras pruebas necesito?  Cules son los prximos pasos que debo seguir? Resumen  La prueba de tolerancia a la glucosa (PTG) es una de las diferentes pruebas que se usan para diagnosticar la diabetes que se  desarrolla durante el embarazo (diabetes mellitus gestacional). La diabetes gestacional es una forma temporal de diabetes que algunas mujeres desarrollan durante el embarazo.  Se le podr realizar la prueba PTG despus de realizarse una prueba de deteccin de glucosa de 1 hora si los resultados de esa prueba indican que es posible que tenga diabetes gestacional. Tambin se le podr realizar esta prueba si tiene algn sntoma o factor de riesgo de diabetes gestacional.  Hable con el mdico sobre lo que significan los [place]. Esta informacin no tiene Marine scientist el consejo del mdico. Asegrese de hacerle al mdico cualquier pregunta que tenga. Document Revised: 10/14/2017 Document Reviewed: 10/14/2017 Elsevier Patient Education  [place] del ligamento redondo Round Ligament Pain  El ligamento redondo es un cordn de msculo y tejido que sirve de sostn para Nurse, learning disability. Puede volverse una fuente de dolor durante el embarazo si se distiende o se torsiona a medida que el beb crece. Generalmente, el dolor [employer]  trimestre (semanas 13 a 76) de Psychiatrist, y Software engineer y Landscape architect el momento del [place]. No se trata de un problema grave y no es perjudicial para el beb. El dolor del ligamento redondo suele ser agudo y punzante, y durar poco tiempo, pero tambin puede ser sordo, persistente y continuo. Se lo percibe en la regin inferior del abdomen o en la ingle. A menudo comienza en la zona ms profunda de la ingle y se extiende hacia regin externa de la cadera. El dolor puede producirse cuando usted:  Cambia sbitamente de posicin, como pasar rpidamente de estar sentada a ponerse de pie.  Se da vuelta en la cama.  Tose o estornuda.  Hace actividad fsica. Siga estas indicaciones en su casa:   Controle su afeccin para detectar cualquier cambio.  Cuando el dolor comience, reljese. Luego pruebe cualquiera de estos mtodos para aliviar el  dolor: ? Psychologist, counselling. ? Flexionar las rodillas hacia el abdomen. ? Acostarse de costado con una almohada debajo del abdomen y [employer] las piernas. ? Sentarse en una baera con agua tibia durante 15 a o hasta que el dolor desaparezca.  Tome los medicamentos de venta libre y los recetados solamente como se lo haya indicado el mdico.  Muvase lentamente cuando se siente o se ponga de pie.  No haga caminatas largas si le generan dolor.  Suspenda o reduzca las actividades fsicas si Public relations account executive.  Concurra a todas las visitas de control como se lo haya indicado el mdico. Esto es importante. Comunquese con un mdico si:  El dolor no desaparece con Scientist, research (medical).  Tiene un dolor en la espalda que no tena antes.  El medicamento no resulta eficaz. Solicite ayuda inmediatamente si:  Tiene fiebre o escalofros.  Tiene contracciones uterinas.  Tiene una hemorragia vaginal abundante.  Tiene nuseas o vmitos.  Tiene diarrea.  Siente dolor al [employer]. Resumen  El dolor del ligamento redondo se siente en la parte inferior del abdomen o la ingle. Generalmente es un dolor agudo y punzante, y dura poco tiempo. Tambin puede ser un dolor sordo, persistente y continuo.  Este dolor por lo general empieza en el segundo trimestre (semanas 13 a 28). Se produce porque el tero se estira a medida que el beb crece, y no es perjudicial para el beb.  Usted puede notar el dolor cuando cambia sbitamente de posicin, cuando toce o estornuda, o durante la actividad fsica.  Relajarse, flexionar las rodillas hacia el abdomen, acostarse sobre un lado o tomar un bao de agua tibia pueden ayudar a Engineer, site.  Solicite ayuda a su mdico si el dolor no desaparece o si tiene hemorragia vaginal, nuseas, vmitos, diarrea o dolor al [employer]. Esta informacin no tiene Theme park manager el consejo del mdico. Asegrese de hacerle al mdico cualquier pregunta que tenga. Document  Revised: 07/30/2018 Document Reviewed: 07/30/2018 Elsevier Patient Education  2020 Elsevier Inc.  Para el dolor de ligamento redondo:  Puede comprar un cinturn de soporte de Facilities manager. Solo compre del tipo que soporta por debajo y por encima del vientre.

## 2020-06-22 NOTE — Progress Notes (Signed)
INITIAL OBSTETRICAL VISIT Patient name: Tabitha [name] MRN [id number]  Date of birth: 05/18/1981 Chief Complaint:   Initial Prenatal Visit  History of Present Illness:   Tabitha [name] is a 39 y.o. G2P0010 Hispanic female at [redacted]w[redacted]d by LMP with an Estimated Date of Delivery: 10/13/20 being seen today for her initial obstetrical visit.  Her obstetrical history is significant for advanced maternal age and late prenatal care. This is a planned pregnancy. She and the father of the baby (FOB) "Domingo Cocking" live together. She has a support system that consists of spouse/family/friends. 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 She reports the upper abdominal pain does away after sitting or walking around .   Patient's last menstrual period was 01/07/2020 (exact date). Last pap unknown. Results were: normal per pt Review of Systems:   Pertinent items are noted in HPI Denies cramping/contractions, leakage of fluid, vaginal bleeding, abnormal vaginal discharge w/ itching/odor/irritation, headaches, visual changes, shortness of breath, chest pain, abdominal pain, severe nausea/vomiting, or problems with urination or bowel movements unless otherwise stated above.  Pertinent History Reviewed:  Reviewed past medical,surgical, social, obstetrical and family history.  Reviewed problem list, medications and allergies. OB History  Gravida Para Term Preterm AB Living  2       1    SAB TAB Ectopic Multiple Live Births  1            # Outcome Date GA Lbr Len/2nd Weight Sex Delivery Anes PTL Lv  2 Current           1 SAB            Physical Assessment:   Vitals:   06/22/20 0911  BP: 98/60  Pulse: 80  Weight: 128 lb (58.1 kg)  Body mass index is 25.85 kg/m.       Physical Examination:  General appearance - well appearing, and in no distress  Mental status - alert, oriented to person, place, and  time  Psych:  She has a normal mood and affect  Skin - warm and dry, normal color, no suspicious lesions noted  Chest - effort normal, all lung fields clear to auscultation bilaterally  Heart - normal rate and regular rhythm  Abdomen - soft, nontender  Extremities:  No swelling or varicosities noted  Pelvic - VULVA: normal appearing vulva with no masses, tenderness or lesions  VAGINA: normal appearing vagina with normal color and discharge, no lesions.   CERVIX: normal appearing cervix without discharge or lesions, no CMT    FHTs by doppler: 138 bpm  Assessment & Plan:  1) High-Risk Pregnancy G2P0010 at [redacted]w[redacted]d with an Estimated Date of Delivery: 10/13/20   2) Initial OB visit - Welcomed to practice and introduced self to patient in addition to discussing other advanced practice providers that she may be seeing at this practice - Congratulated patient - Anticipatory guidance on upcoming appointments - Educated on COVID19 and pregnancy and the integration of virtual appointments  - Educated on babyscripts app- patient reports she has not received email, encouraged to look in spam folder and to call office if she still has not received email - patient verbalizes understanding    3) Supervision of high risk pregnancy, antepartum - Cervicovaginal ancillary only( [facility]) - Wet Prep GC/CT, Trich and BV done - No pap d/t cost and no coverage  4) Gastroesophageal reflux during pregnancy, antepartum - Rx for  omeprazole (PRILOSEC) 40 MG capsule; Take 1 capsule (40 mg total) by mouth daily.  Dispense: 30 capsule; Refill: 3  5) Round ligament pain - Rx for Elastic Bandages & Supports (COMFORT FIT MATERNITY SUPP SM) MISC; 1 Units by Does not apply route daily as needed.  Dispense: 1 each; Refill: 0  6) Pelvic pain affecting pregnancy, antepartum - Rx for Elastic Bandages & Supports (COMFORT FIT MATERNITY SUPP SM) MISC; 1 Units by Does not apply route daily as needed.  Dispense: 1 each; Refill:  0  7) Placenta previa antepartum - Information provided on placenta previa - Advised NO SEX and NO VAGINAL EXAMS  8) Language barrier affecting health care - [employer] Video Interpreter Mickel Baas [phone] used for entire visit   Meds:  Meds ordered this encounter  Medications  . omeprazole (PRILOSEC) 40 MG capsule    Sig: Take 1 capsule (40 mg total) by mouth daily.    Dispense:  30 capsule    Refill:  3    Order Specific Question:   Supervising Provider    Answer:   Donnamae Jude [[id number]]  . Elastic Bandages & Supports (COMFORT FIT MATERNITY SUPP SM) MISC    Sig: 1 Units by Does not apply route daily as needed.    Dispense:  1 each    Refill:  0    Order Specific Question:   Supervising Provider    Answer:   Donnamae Jude [[id number]]    Initial labs obtained Continue prenatal vitamins Reviewed n/v relief measures and warning s/s to report Reviewed recommended weight gain based on pre-gravid BMI Encouraged well-balanced diet Genetic Screening discussed: requested Cystic fibrosis, SMA, Fragile X screening discussed requested The nature of [facility] with multiple MDs and other Advanced Practice Providers was explained to patient; also emphasized that residents, students are part of our team.  Discussed optimized OB schedule and video visits. Advised can have an in-office visit whenever she feels she needs to be seen.  Does not have own BP cuff. Give BP cuff. Explained to patient that BP will be mailed to her house. Check BP weekly, let us know if >140/90. Advised to call during normal business hours and there is an after-hours nurse line available.    Follow-up: Return in about 4 weeks (around 07/20/2020) for Return OB 2hr GTT.   No orders of the defined types were placed in this encounter.   Laury Deep MSN, CNM 06/22/2020

## 2020-06-23 LAB — CERVICOVAGINAL ANCILLARY ONLY
Bacterial Vaginitis (gardnerella): NEGATIVE
Candida Glabrata: NEGATIVE
Candida Vaginitis: NEGATIVE
Chlamydia: NEGATIVE
Comment: NEGATIVE
Comment: NEGATIVE
Comment: NEGATIVE
Comment: NEGATIVE
Comment: NEGATIVE
Comment: NORMAL
Neisseria Gonorrhea: NEGATIVE
Trichomonas: NEGATIVE

## 2020-07-19 ENCOUNTER — Ambulatory Visit (INDEPENDENT_AMBULATORY_CARE_PROVIDER_SITE_OTHER): Payer: Self-pay | Admitting: Medical

## 2020-07-19 ENCOUNTER — Other Ambulatory Visit: Payer: Self-pay

## 2020-07-19 ENCOUNTER — Telehealth: Payer: Self-pay | Admitting: General Practice

## 2020-07-19 ENCOUNTER — Encounter: Payer: Self-pay | Admitting: Medical

## 2020-07-19 VITALS — BP 97/62 | HR 77 | Temp 97.7°F | Wt 138.4 lb

## 2020-07-19 DIAGNOSIS — O4400 Placenta previa specified as without hemorrhage, unspecified trimester: Secondary | ICD-10-CM

## 2020-07-19 DIAGNOSIS — Z23 Encounter for immunization: Secondary | ICD-10-CM

## 2020-07-19 DIAGNOSIS — Z3400 Encounter for supervision of normal first pregnancy, unspecified trimester: Secondary | ICD-10-CM

## 2020-07-19 DIAGNOSIS — O4402 Placenta previa specified as without hemorrhage, second trimester: Secondary | ICD-10-CM

## 2020-07-19 DIAGNOSIS — Z3A27 27 weeks gestation of pregnancy: Secondary | ICD-10-CM

## 2020-07-19 MED ORDER — TETANUS-DIPHTH-ACELL PERTUSSIS 5-2.5-18.5 LF-MCG/0.5 IM SUSP: 0.5000 mL | Freq: Once | INTRAMUSCULAR | 0 refills | Status: AC

## 2020-07-19 NOTE — Progress Notes (Signed)
   PRENATAL VISIT NOTE  Subjective:  Tabitha [name] is a 39 y.o. G2P0010 at [redacted]w[redacted]d being seen today for ongoing prenatal care.  She is currently monitored for the following issues for this high-risk pregnancy and has Acne; Insomnia; Trigeminal neuralgia of right side of face; Therapeutic drug monitoring; Supervision of normal first pregnancy, antepartum; and Placenta previa antepartum on their problem list.  Patient reports intermittent LE edema.  Contractions: Not present. Vag. Bleeding: None.  Movement: Present. Denies leaking of fluid.   The following portions of the patient's history were reviewed and updated as appropriate: allergies, current medications, past family history, past medical history, past social history, past surgical history and problem list.   Objective:   Vitals:   07/19/20 0834  BP: 97/62  Pulse: 77  Temp: 97.7 F (36.5 C)  Weight: 138 lb 6.4 oz (62.8 kg)    Fetal Status: Fetal Heart Rate (bpm): 156 Fundal Height: 26 cm Movement: Present     General:  Alert, oriented and cooperative. Patient is in no acute distress.  Skin: Skin is warm and dry. No rash noted.   Cardiovascular: Normal heart rate noted  Respiratory: Normal respiratory effort, no problems with respiration noted  Abdomen: Soft, gravid, appropriate for gestational age.  Pain/Pressure: Absent     Pelvic: Cervical exam deferred        Extremities: Normal range of motion.  Edema: None  Mental Status: Normal mood and affect. Normal behavior. Normal judgment and thought content.   Assessment and Plan:  Pregnancy: G2P0010 at [redacted]w[redacted]d 1. Supervision of normal first pregnancy, antepartum - Glucose Tolerance, 2 Hours w/1 Hour - HIV Antibody (routine testing w rflx) - RPR - CBC - Tdap (BOOSTRIX) 5-2.5-18.5 LF-MCG/0.5 injection; Inject 0.5 mLs into the muscle once for 1 dose.  Dispense: 0.5 mL; Refill: 0 - Peds list given and patient encouraged to decide by 36 weeks  - Still unsure of MOC   2.  Placenta previa antepartum - Follow-up US scheduled with [facility] at 28 weeks  - Patient advised of need for pelvic rest  - Bleeding precautions discussed  - Advised that if placenta previa does not resolve delivery will be C/S  3. [redacted] weeks gestation of pregnancy  Preterm labor symptoms and general obstetric precautions including but not limited to vaginal bleeding, contractions, leaking of fluid and fetal movement were reviewed in detail with the patient. Please refer to After Visit Summary for other counseling recommendations.   Return in about 2 weeks (around 08/02/2020) for LOB.  No future appointments.  Vonzella Nipple, PA-C

## 2020-07-19 NOTE — Telephone Encounter (Signed)
Pt is scheduled for f/u US on 07/27/2020 at 11:30am with [facility].

## 2020-07-19 NOTE — Patient Instructions (Addendum)
Fetal Movement Counts °Patient Name: ________________________________________________ Patient Due Date: ____________________ °What is a fetal movement count? ° °A fetal movement count is the number of times that you feel your baby move during a certain amount of time. This may also be called a fetal kick count. A fetal movement count is recommended for every pregnant woman. You may be asked to start counting fetal movements as early as week 28 of your pregnancy. °Pay attention to when your baby is most active. You may notice your baby's sleep and wake cycles. You may also notice things that make your baby move more. You should do a fetal movement count: °· When your baby is normally most active. °· At the same time each day. °A good time to count movements is while you are resting, after having something to eat and drink. °How do I count fetal movements? °1. Find a quiet, comfortable area. Sit, or lie down on your side. °2. Write down the date, the start time and stop time, and the number of movements that you felt between those two times. Take this information with you to your health care visits. °3. Write down your start time when you feel the first movement. °4. Count kicks, flutters, swishes, rolls, and jabs. You should feel at least 10 movements. °5. You may stop counting after you have felt 10 movements, or if you have been counting for 2 hours. Write down the stop time. °6. If you do not feel 10 movements in 2 hours, contact your health care provider for further instructions. Your health care provider may want to do additional tests to assess your baby's well-being. °Contact a health care provider if: °· You feel fewer than 10 movements in 2 hours. °· Your baby is not moving like he or she usually does. °Date: ____________ Start time: ____________ Stop time: ____________ Movements: ____________ °Date: ____________ Start time: ____________ Stop time: ____________ Movements: ____________ °Date: ____________  Start time: ____________ Stop time: ____________ Movements: ____________ °Date: ____________ Start time: ____________ Stop time: ____________ Movements: ____________ °Date: ____________ Start time: ____________ Stop time: ____________ Movements: ____________ °Date: ____________ Start time: ____________ Stop time: ____________ Movements: ____________ °Date: ____________ Start time: ____________ Stop time: ____________ Movements: ____________ °Date: ____________ Start time: ____________ Stop time: ____________ Movements: ____________ °Date: ____________ Start time: ____________ Stop time: ____________ Movements: ____________ °This information is not intended to replace advice given to you by your health care provider. Make sure you discuss any questions you have with your health care provider. °Document Revised: 08/05/2019 Document Reviewed: 08/05/2019 °Elsevier Patient Education © 2020 Elsevier Inc. °Braxton Hicks Contractions °Contractions of the uterus can occur throughout pregnancy, but they are not always a sign that you are in labor. You may have practice contractions called Braxton Hicks contractions. These false labor contractions are sometimes confused with true labor. °What are Braxton Hicks contractions? °Braxton Hicks contractions are tightening movements that occur in the muscles of the uterus before labor. Unlike true labor contractions, these contractions do not result in opening (dilation) and thinning of the cervix. Toward the end of pregnancy (32-34 weeks), Braxton Hicks contractions can happen more often and may become stronger. These contractions are sometimes difficult to tell apart from true labor because they can be very uncomfortable. You should not feel embarrassed if you go to the hospital with false labor. °Sometimes, the only way to tell if you are in true labor is for your health care provider to look for changes in the cervix. The health care provider   will do a physical exam and may  monitor your contractions. If you are not in true labor, the exam should show that your cervix is not dilating and your water has not broken. °If there are no other health problems associated with your pregnancy, it is completely safe for you to be sent home with false labor. You may continue to have Braxton Hicks contractions until you go into true labor. °How to tell the difference between true labor and false labor °True labor °· Contractions last 30-70 seconds. °· Contractions become very regular. °· Discomfort is usually felt in the top of the uterus, and it spreads to the lower abdomen and low back. °· Contractions do not go away with walking. °· Contractions usually become more intense and increase in frequency. °· The cervix dilates and gets thinner. °False labor °· Contractions are usually shorter and not as strong as true labor contractions. °· Contractions are usually irregular. °· Contractions are often felt in the front of the lower abdomen and in the groin. °· Contractions may go away when you walk around or change positions while lying down. °· Contractions get weaker and are shorter-lasting as time goes on. °· The cervix usually does not dilate or become thin. °Follow these instructions at home: ° °· Take over-the-counter and prescription medicines only as told by your health care provider. °· Keep up with your usual exercises and follow other instructions from your health care provider. °· Eat and drink lightly if you think you are going into labor. °· If Braxton Hicks contractions are making you uncomfortable: °? Change your position from lying down or resting to walking, or change from walking to resting. °? Sit and rest in a tub of warm water. °? Drink enough fluid to keep your urine pale yellow. Dehydration may cause these contractions. °? Do slow and deep breathing several times an hour. °· Keep all follow-up prenatal visits as told by your health care provider. This is important. °Contact a  health care provider if: °· You have a fever. °· You have continuous pain in your abdomen. °Get help right away if: °· Your contractions become stronger, more regular, and closer together. °· You have fluid leaking or gushing from your vagina. °· You pass blood-tinged mucus (bloody show). °· You have bleeding from your vagina. °· You have low back pain that you never had before. °· You feel your baby’s head pushing down and causing pelvic pressure. °· Your baby is not moving inside you as much as it used to. °Summary °· Contractions that occur before labor are called Braxton Hicks contractions, false labor, or practice contractions. °· Braxton Hicks contractions are usually shorter, weaker, farther apart, and less regular than true labor contractions. True labor contractions usually become progressively stronger and regular, and they become more frequent. °· Manage discomfort from Braxton Hicks contractions by changing position, resting in a warm bath, drinking plenty of water, or practicing deep breathing. °This information is not intended to replace advice given to you by your health care provider. Make sure you discuss any questions you have with your health care provider. °Document Revised: 11/28/2017 Document Reviewed: 05/01/2017 °Elsevier Patient Education © 2020 Elsevier Inc. ° °AREA PEDIATRIC/FAMILY PRACTICE PHYSICIANS ° °[place] ([address]) °• [facility] °o Chambliss, MD; Eniola, MD; Hale, MD; Hensel, MD; McDiarmid, MD; McIntyer, MD; Neal, MD; Walden, MD °o [address] [place],  [address] °o [phone] °o Mon-Fri 8:30-12:30, 1:30-5:00 °o Providers come to see babies   at [facility] °o Accepting Medicaid °• [facility] °o Limited providers who accept newborns: Koirala, MD; Morrow, MD; Wolters, MD °o [address], [place], [place] [address] °o [phone] °o Mon-Fri 8:00-5:30 °o Babies seen by providers at  [facility] °o Does NOT accept Medicaid °o Please call early in hospitalization for appointment (limited availability)  °• [facility] °o Mulberry, MD °o [address] [place], [place] [address] °o [phone] °o Mon, Tue, Thur, Fri 8:30-5:00, Wed 10:00-7:00 (closed 1-2pm) °o Babies seen by [facility] providers °o Accepting Medicaid °• [facility] °o Rubin, MD °o [address], [place], [place] [address] °o [phone] °o Mon-Fri 8:30-5:00, Sat 8:30-12:00 °o Provider comes to see babies at [facility] °o Accepting Medicaid °o Must have been referred from current patients or contacted office prior to delivery °• [facility] ([facility]) °o Brown, MD; Chandler, MD; Ettefagh, MD; Grant, MD; Lester, MD; McCormick, MD; McQueen, MD; Prose, MD; Simha, MD; Stanley, MD; Stryffeler, NP; Tebben, NP °o [address], [place], [place] [address] °o [phone] °o Mon, Tue, Thur, Fri 8:30-5:30, Wed 9:30-5:30, Sat 8:30-12:30 °o Babies seen by [facility] providers °o Accepting Medicaid °o Only accepting infants of first-time parents or siblings of current patients °o Hospital discharge coordinator will make follow-up appointment °• Jack Amos °o [address], [place], [place]  [address] °o [phone]   Fax - [phone] °• [facility] °o [address], [place], [place]  [address] °o Phone - [phone]   Fax - [phone] °• Shilpa Gosrani °o [address], [place], [place]  [address] °o [phone] ° °[place] ([address]) °• [facility] °o Bates, MD; [place], MD; Cooper, Cox, MD; MD; Davis, MD; Dovico, MD; Ettefaugh, MD; Little, MD; Lowe, MD; Keiffer, MD; Melvin, MD; Sumner, MD; Williams, MD °o [address], [place], [place] [address] °o [phone] °o Mon-Fri 8:30-5:00 (extended evenings Mon-Thur as needed), Sat-Sun  10:00-1:00 °o Providers come to see babies at [facility] °o Accepting Medicaid for families of first-time babies and families with all children in the household age 3 and under. Must register with office prior to making appointment (M-F only). °• [facility] °o Henson, NP; Knapp, MD; Lalonde, MD; Tysinger, PA °o [address] [place], [place] [address] °o [phone] °o Mon-Fri 8:00-5:00 °o Babies seen by providers at [facility] °o Does NOT accept Medicaid/Commercial Insurance Only °• [facility] ([facility]  °o Artis, MD; Barnes, MD; Bratton, MD; Coccaro, MD; Lockett [place], MD; Kramer, MD; Marshall, MD; Netherton, MD; Poleto, MD; Skinner, MD °o [address] [place], [place] [address] °o [phone] °o Mon-Fri 8:30-5:30, Sat (Oct.-Mar.) 9:00-1:00 °o Babies seen by providers at [facility] °o Accepting Medicaid ° °[place] ([address]) °• [facility] °o Reid, MD; Warner, MD °o [address], [place], [place] [address] °o [phone] °o Mon-Fri 8:30-5:00, Sat 8:30-12:00 °o Providers come to see babies at [facility] °o Does NOT accept Medicaid °• [facility] °o Becker, PA; Hagler, MD; Scifres, PA; Sun, MD; Swayne, MD °o [address], [place], [place] [address] °o [phone] °o Mon-Fri 8:00-5:00 °o Babies seen by providers at [facility] °o Does NOT accept Medicaid °o Only accepting babies of parents who are patients °o Please call   early in hospitalization for appointment (limited availability) °• [facility] °o Clark, MD; Frye, MD; Kelleher, MD; Mack, NP; Miller, MD; O'Keller, MD; Patterson, NP; Pudlo, MD; Puzio, MD; Thomas, MD; Tucker, MD; Twiselton, MD °o [address], [place], [place] [address] °o [phone] °o Mon-Fri 8:00-5:00, Sat 9:00-12:00 °o Providers come to see babies at [facility] °o Does NOT accept  Medicaid ° °[place] ([address]) °• [facility] °o Limited providers accepting new patients: Brake, NP; Wharton, PA °o [address], [place], [place] [address] °o [phone] °o Mon-Fri 8:00-5:00 °o Babies seen by providers at [facility] °o Does NOT accept Medicaid °o Only accepting babies of parents who are patients °o Please call early in hospitalization for appointment (limited availability) °• [facility] °o Gay, MD; Quinlan, MD °o [address] [place], [place] [address] °o [phone] (press 1 to schedule appointment) °o Mon-Fri 8:00-5:00 °o Providers come to see babies at [facility] °o Does NOT accept Medicaid °• [facility] °o Mazer, MD °o [address] [place], [place] [address] °o [phone] °o Mon-Fri 8:30-5:00 (lunch 12:30-1:00), extended hours by appointment only Wed 5:00-6:30 °o Babies seen by [facility] providers °o Accepting Medicaid °• [facility] °o Banks, MD; Jordan, MD; Koberlein, MD °o [address], [place], [place] [address] °o [phone] °o Mon-Fri 8:00-5:00 °o Babies seen by [facility] providers °o Does NOT accept Medicaid °• [facility] °o Parker, MD; Hunter, MD; Wallace, DO °o [address] [place], [place] [address] °o [phone] °o Mon-Fri 8:00-5:00 °o Babies seen by [facility] providers °o Does NOT accept Medicaid °• [facility] °o Brandon, PA; Brecken, PA; Christy, NP; Dees, MD; DeClaire, MD; DeWeese, MD; Hansen, NP; Mills, NP; Parrish, NP; Smoot, NP; Summer, MD; Vapne, MD °o [address] [place], [place] [address] °o [phone] °o Mon-Fri 8:30-5:00, Sat 10:00-1:00 °o Providers come to see babies at [facility] °o Does NOT accept Medicaid °o Free prenatal information session Tuesdays at 4:45pm °• [facility] °o Bouska, MD; Gordon, PA; Jeffery, PA; Weber, PA °o [address] [place] [address] °o [phone] °o Mon-Fri 7:30-5:30 °o Babies seen by [facility] providers °• [facility] °o [address], [place], [place]  [address] °o [phone]   Fax - [phone] ° °[place] ([address]) °• [facility] °o Reese, MD °o [address] [place], [place] [address] °o [phone] °o Mon-Thur 8:00-6:00 °o Providers come to see babies at [facility] °o Accepting Medicaid °• [facility] °o Anderson, NP; Badger, MD; Beal, PA; Spencer, PA °o [address] [place], [place] [address] °o [phone] °o Mon-Thur 7:30-7:30, Fri 7:30-4:30 °o Babies seen by [facility] providers °o Accepting Medicaid °• [facility] °o Agbuya, MD; Klett, NP; Romgoolam, MD °o [address], [place], [place] [address] °o [phone] °o Mon-Fri 8:30-5:00, Sat 8:30-12:00 °o Providers come to see babies at [facility] °o Accepting Medicaid °o Must have “Meet & Greet” appointment at office prior to delivery °• [facility] ([facility]) °o McCord, MD; Wallace, MD; Wood, MD °o [address], [place],  [address] °o [phone] °o Mon-Wed 8:00-6:00, Thur-Fri 8:00-5:00, Sat 9:00-12:00 °o Providers come to see babies at [facility] °o Does NOT accept Medicaid °o Only accepting siblings of current patients °• [facility]  °  o [address], [place], [place]  [address] °o [phone]   Fax - [phone] °• [facility] °o [address], [place], [place]  [address] °o [phone]   Fax - [phone] ° °[place] ([address]) °• [facility] [facility] °o Cirigliano, DO; Matthews, DO °o [address] [place], [place] [address] °o [phone] °o Mon-Fri 7:00-5:00 °o Babies seen by [facility] providers °o Does NOT  accept Medicaid °• [facility] °o Briscoe, MD; Howley, PA; Moreira, PA °o [address], [place], [place] [address] °o [phone] °o Mon-Fri 8:00-5:00 °o Babies seen by [facility] providers °o Accepting Medicaid °• [facility] °o Boyd, MD; Church, PA; Jones, NP; Osborn, PA °o [address], [place], [place] [address] °o [phone] °o Mon-Fri 8:00-5:00 °o Babies seen by providers at [facility] °o Accepting Medicaid ° °[place] ([address]) °•  [facility] [place] °o Wendling, DO °o [address] [place], [place] [address] °o [phone] °o Mon-Fri 8:00-5:00 °o Babies seen by [facility] providers °o Does NOT accept Medicaid °o Limited availability, please call early in hospitalization to schedule follow-up °• [facility] °o Calderon, PA; Cummings, MD; Dillard, MD; Martin, PA; Olson, MD; VanDeven, PA °o [address], [place], [place] [address] °o [phone] °o Mon-Fri 8:30-5:00, Sat 9:00-12:00 °o Babies seen by providers at [facility] °o Accepting Medicaid °o Please register online then schedule online or call office °o [email or web address] °• [facility] °o Hunter, NP; Kumar, MD; Martin Rogers, PA °o [address], [place], [place] [address] °o [phone] °o Mon-Fri 8:00-5:00 °o Babies seen by providers at [facility] °o Accepting Medicaid °• [facility] ([facility] °o [place], MD; Kristi Fleenor, NP; West, MD °o [address], [place], [place] [address] °o [phone] °o Mon-Fri 8:00-5:30, Sat&Sun by appointment (phones open at 8:30) °o Babies seen by [facility] providers °o Accepting Medicaid °o Must be a first-time baby or sibling of current patient °• [facility] [place]  °o [address], [place], [place]  [address] °o [phone]   Fax - [phone] ° °[place] ([address]) °• [facility] °o Brown, PA; Cowen, PA; Rice, MD; Helton, PA; Spry, MD °o [address] [place], [place] [address] °o [phone] °o Mon-Thur 8:00-7:00, Fri 8:00-5:00, Sat 8:00-12:00, Sun 9:00-12:00 °o Babies seen by [facility] providers °o Accepting Medicaid °• [facility] [place] °o Coe-Goins, MD; Marshall, MD; Pierre-Louis, MD °o [address] [place] [address], [place], [place] [address] °o [phone] °o Mon-Thur 8:00-5:00 °o Babies seen by providers at [facility] °o Accepting Medicaid °• [facility] °o Bratton, MD; Coe-Goins, MD; Hayes, MD; Lewis, MD; List, MD; [place], MD; Marshall, MD; Moran, MD; O'Neal, MD; Pierre-Louis, MD; Pitonzo, MD; Scholer, MD; Spangle, MD °o [address] [place],  [address] °o [phone] °o Mon-Fri 8:00-5:30, Sat (Oct.-Mar.) 9:00-1:00 °o Babies seen by providers at [facility] °o Accepting Medicaid °o Must fill out new patient packet, available online at [email or web address] °• [facility] ([facility] °o Friddle, NP; Harris, NP; Kelly, NP; Logan,   MD; Melvin, PA; Poth, MD; Ramadoss, MD; Stanton, NP °o [address], [place], [place] [address] °o [phone] °o Mon-Thur 8:00-5:30, Fri 8:00-5:00 °o Babies seen by providers at [facility] °o Accepting Medicaid ° °[place] ([address]) °• [facility] °o Dixon, PA; [place], MD; Pickard, MD; Tapia, PA °o [address], [place], [place] [address] °o [phone] °o Mon-Fri 8:00-5:00 °o Babies seen by providers at [facility] °o Accepting Medicaid  ° °[place] ([address]) °• [facility] [place] °o Masneri, DO; Meyers, MD; Nelson, PA °o [address], [place], [place]  [address] °o [phone] °o Mon-Fri 8:00-5:00 °o Babies seen by providers at [facility] °o Does NOT accept Medicaid °o Limited appointment availability, please call early in hospitalization ° °• [facility] [place] °o Kunedd, DO; McGowen, MD °o [address], [place], [place] [address] °o [phone] °o Mon-Fri 8:00-5:00 °o Babies seen by [facility] providers °o Does NOT accept Medicaid °• [facility] [place] °o Cameron, MD; MacDonald, MD; Michaels, PA; Nayak, MD °o [address] [place] [address], [place], [place] [address] °o [phone] °o Mon-Fri 8:00-5:00 °o After hours clinic ([address] [place], [place] [address]) [phone] Mon-Fri 5:00-8:00, Sat 12:00-6:00, Sun 10:00-4:00 °o Babies seen by [facility] providers °o Accepting Medicaid °• [facility] [place] °o [address], [place], [place]  [address] °o [phone]   Fax - [phone] ° °[place] ([address]) °• [facility] [place] °o Andy, MD °o [address], [place], [place] [address] °o [phone] °o Mon-Fri 8:00-5:00 °o Babies seen by [facility] providers °o Does NOT accept Medicaid °• [facility] [place] ([facility] [place]) °o Eksir, MD °o [address], [place], [place] [address] °o [phone] °o Mon-Thur 8:00-7:00, Fri 8:00-5:00, Sat 8:00-12:00 °o Babies seen by providers at [facility] °o Accepting Medicaid - but does not have vaccinations in office (must be received elsewhere) °o Limited availability, please call early in hospitalization ° °[place] ([address]) °• [facility]  °o Charlene Flemming, MD °o [address], [place] [address] °o [phone]  Fax [phone] ° ° °

## 2020-07-20 LAB — CBC
Hematocrit: 36.4 % (ref 34.0–46.6)
Hemoglobin: 11.6 g/dL (ref 11.1–15.9)
MCH: 29.8 pg (ref 26.6–33.0)
MCHC: 31.9 g/dL (ref 31.5–35.7)
MCV: 94 fL (ref 79–97)
Platelets: 290 10*3/uL (ref 150–450)
RBC: 3.89 x10E6/uL (ref 3.77–5.28)
RDW: 11.7 % (ref 11.7–15.4)
WBC: 6.9 10*3/uL (ref 3.4–10.8)

## 2020-07-20 LAB — RPR: RPR Ser Ql: NONREACTIVE

## 2020-07-20 LAB — GLUCOSE TOLERANCE, 2 HOURS W/ 1HR
Glucose, 1 hour: 97 mg/dL (ref 65–179)
Glucose, 2 hour: 113 mg/dL (ref 65–152)
Glucose, Fasting: 76 mg/dL (ref 65–91)

## 2020-07-20 LAB — HIV ANTIBODY (ROUTINE TESTING W REFLEX): HIV Screen 4th Generation wRfx: NONREACTIVE

## 2020-08-02 ENCOUNTER — Encounter: Payer: Self-pay | Admitting: General Practice

## 2020-08-03 ENCOUNTER — Other Ambulatory Visit: Payer: Self-pay

## 2020-08-03 ENCOUNTER — Ambulatory Visit (INDEPENDENT_AMBULATORY_CARE_PROVIDER_SITE_OTHER): Payer: Self-pay | Admitting: Obstetrics and Gynecology

## 2020-08-03 VITALS — BP 104/62 | HR 90 | Temp 98.9°F | Wt 140.8 lb

## 2020-08-03 DIAGNOSIS — Z3A29 29 weeks gestation of pregnancy: Secondary | ICD-10-CM

## 2020-08-03 DIAGNOSIS — Z789 Other specified health status: Secondary | ICD-10-CM

## 2020-08-03 DIAGNOSIS — O4400 Placenta previa specified as without hemorrhage, unspecified trimester: Secondary | ICD-10-CM

## 2020-08-03 DIAGNOSIS — Z3400 Encounter for supervision of normal first pregnancy, unspecified trimester: Secondary | ICD-10-CM

## 2020-08-03 NOTE — Progress Notes (Signed)
° °  LOW-RISK PREGNANCY OFFICE VISIT Patient name: Tabitha [name] MRN [id number]  Date of birth: 1981-08-23 Chief Complaint:   Routine Prenatal Visit  History of Present Illness:   Amilah Greenspan is a 39 y.o. G2P0010 female at [redacted]w[redacted]d with an Estimated Date of Delivery: 10/13/20 being seen today for ongoing management of a low-risk pregnancy.  Today she reports no complaints and irregular UCs 3 days ago, but none now. Contractions: Not present. Vag. Bleeding: None.  Movement: Present. denies leaking of fluid. Review of Systems:   Pertinent items are noted in HPI Denies abnormal vaginal discharge w/ itching/odor/irritation, headaches, visual changes, shortness of breath, chest pain, abdominal pain, severe nausea/vomiting, or problems with urination or bowel movements unless otherwise stated above. Pertinent History Reviewed:  Reviewed past medical,surgical, social, obstetrical and family history.  Reviewed problem list, medications and allergies. Physical Assessment:   Vitals:   08/03/20 1507  BP: 104/62  Pulse: 90  Temp: 98.9 F (37.2 C)  Weight: 140 lb 12.8 oz (63.9 kg)  Body mass index is 28.44 kg/m.        Physical Examination:   General appearance: Well appearing, and in no distress  Mental status: Alert, oriented to person, place, and time  Skin: Warm & dry  Cardiovascular: Normal heart rate noted  Respiratory: Normal respiratory effort, no distress  Abdomen: Soft, gravid, nontender  Pelvic: Cervical exam deferred         Extremities: Edema: None  Fetal Status: Fetal Heart Rate (bpm): 152 Fundal Height: 27 cm Movement: Present    No results found for this or any previous visit (from the past 24 hour(s)).  Assessment & Plan:  1) Low-risk pregnancy G2P0010 at [redacted]w[redacted]d with an Estimated Date of Delivery: 10/13/20   2) Supervision of normal first pregnancy, antepartum - Advised of normal GTT  - Anticipatory guidance for return visit in 4 wks  3) [redacted] weeks gestation of  pregnancy  4) Placenta previa antepartum  - RESOLVED per U/S on 07/27/20  5) Language barrier affecting health care - [employer] video Interpreter [place] [phone]   Meds: No orders of the defined types were placed in this encounter.  Labs/procedures today: none  Plan:  Continue routine obstetrical care   Reviewed: Preterm labor symptoms and general obstetric precautions including but not limited to vaginal bleeding, contractions, leaking of fluid and fetal movement were reviewed in detail with the patient.  All questions were answered.    Follow-up: Return in about 4 weeks (around 08/31/2020) for Return OB visit.  No orders of the defined types were placed in this encounter.  Raelyn Mora MSN, CNM 08/03/2020 3:17 PM

## 2020-08-04 ENCOUNTER — Encounter: Payer: Self-pay | Admitting: Obstetrics and Gynecology

## 2020-08-04 DIAGNOSIS — Z789 Other specified health status: Secondary | ICD-10-CM | POA: Insufficient documentation

## 2020-08-31 ENCOUNTER — Encounter: Payer: Self-pay | Admitting: Obstetrics and Gynecology

## 2020-08-31 ENCOUNTER — Ambulatory Visit (INDEPENDENT_AMBULATORY_CARE_PROVIDER_SITE_OTHER): Payer: Self-pay | Admitting: Obstetrics and Gynecology

## 2020-08-31 ENCOUNTER — Other Ambulatory Visit: Payer: Self-pay

## 2020-08-31 ENCOUNTER — Other Ambulatory Visit (HOSPITAL_COMMUNITY)
Admission: RE | Admit: 2020-08-31 | Discharge: 2020-08-31 | Disposition: A | Discharge status: Home / Self Care | Payer: Self-pay | Source: Ambulatory Visit | Attending: Obstetrics and Gynecology | Admitting: Obstetrics and Gynecology

## 2020-08-31 VITALS — BP 105/64 | HR 96 | Temp 98.2°F | Wt 145.4 lb

## 2020-08-31 DIAGNOSIS — O4700 False labor before 37 completed weeks of gestation, unspecified trimester: Secondary | ICD-10-CM

## 2020-08-31 DIAGNOSIS — Z3A33 33 weeks gestation of pregnancy: Secondary | ICD-10-CM | POA: Insufficient documentation

## 2020-08-31 DIAGNOSIS — Z3400 Encounter for supervision of normal first pregnancy, unspecified trimester: Secondary | ICD-10-CM | POA: Insufficient documentation

## 2020-08-31 DIAGNOSIS — O479 False labor, unspecified: Secondary | ICD-10-CM

## 2020-08-31 MED ORDER — BETAMETHASONE SOD PHOS & ACET 6 (3-3) MG/ML IJ SUSP
12.0000 mg | Freq: Once | INTRAMUSCULAR | Status: AC
  Administered 2020-08-31: 12 mg via INTRAMUSCULAR

## 2020-08-31 MED ORDER — BETAMETHASONE SOD PHOS & ACET 6 (3-3) MG/ML IJ SUSP
12.0000 mg | INTRAMUSCULAR | Status: AC
  Administered 2020-09-01: 12 mg via INTRAMUSCULAR

## 2020-08-31 NOTE — Patient Instructions (Signed)
Informacin sobre parto y trabajo de parto prematuros Preterm Labor and Birth Information El embarazo tiene generalmente una duracin de 39 a 41 semanas. El trabajo de parto es prematuro cuando se inicia muy pronto. Comienza antes de completar las 37 semanas de embarazo. Cules son los factores de riesgo del trabajo de parto prematuro? Existen mayores probabilidades de trabajo de parto prematuro en mujeres con las siguientes caractersticas:  Tuvieron una infeccin durante el embarazo.  El cuello uterino es corto.  Tuvieron trabajo de parto prematuro anteriormente.  Se sometieron a una ciruga en el cuello uterino.  Son menores de 17aos.  Tienen ms de 35aos.  Son afroamericanas.  Estn embarazadas de dos o ms bebs.  Consumen drogas mientras estn embarazadas.  Fuman mientras estn embarazadas.  No aumentan de peso lo suficiente durante el embarazo.  Se embarazaron inmediatamente despus de otro embarazo. Cules son los sntomas del trabajo de parto prematuro? Los sntomas del trabajo de parto prematuro incluyen lo siguiente:  Calambres. Los calambres pueden parecerse a los que tiene una mujer durante el perodo menstrual. Los calambres pueden presentarse con diarrea.  Dolor de vientre (abdomen).  Dolor en la zona lumbar.  Tiene contracciones regulares o endurecimiento del tero. Siente como si el vientre se endurece.  Presin en la zona inferior del vientre que parece empeorar.  Pierde ms lquido (secrecin) por la vagina. El lquido puede ser acuoso o con sangre.  Ruptura de la bolsa de aguas. Por qu es importante notar los signos del trabajo de parto prematuro? Los bebs que nacen antes de tiempo pueden no estar completamente desarrollados. Estos pueden tener un riesgo mayor de padecer:  Problemas cardacos a largo plazo.  Problemas pulmonares a largo plazo.  Dificultades para controlar los sistemas corporales, por ejemplo, respirar.  Hemorragia  cerebral.  Una afeccin que se denomina parlisis cerebral.  Dificultades en el aprendizaje.  Muerte. Estos riesgos son mucho mayores para bebs que nacen antes de las 34semanas de embarazo. Cmo se trata el trabajo de parto prematuro? El tratamiento depende de lo siguiente:  El tiempo de embarazo.  Su estado de salud.  La salud del beb. El tratamiento puede incluir lo siguiente:  Un punto (sutura) en el cuello uterino. Al parir, el cuello uterino se abre para que el beb pueda salir. El punto impide que el cuello uterino se abra antes de tiempo.  Permanecer en el hospital.  Tomar medicamentos como, por ejemplo: ? Medicamentos hormonales. ? Medicamentos para detener las contracciones. ? Medicamentos para ayudar a la maduracin de los pulmones del beb. ? Medicamentos para evitar que el beb desarrolle parlisis cerebral. Qu debo hacer si estoy en trabajo de parto prematuro? Si cree que est en trabajo de parto demasiado pronto, llame a su mdico de inmediato. Cmo puedo prevenir el trabajo de parto prematuro?  No use productos que contengan tabaco. ? Estos incluyen cigarrillos, tabaco para mascar y cigarrillos electrnicos. ? Si necesita ayuda para dejar de fumar, consulte al mdico.  No consuma drogas.  No tome ningn medicamento si el mdico no se lo indic.  Consulte al mdico antes de empezar a tomar cualquier suplemento de hierbas.  Asegrese aumentar de peso como corresponde.  Tenga cuidado con las infecciones. Si cree que puede tener una infeccin, consulte al mdico para que la revisen inmediatamente.  Infrmele al mdico si ha tenido trabajo de parto prematuro anteriormente. Esta informacin no tiene como fin reemplazar el consejo del mdico. Asegrese de hacerle al mdico cualquier pregunta que tenga. Document   Revised: 03/26/2017 Document Reviewed: 05/08/2016 Elsevier Patient Education  2020 [employer]. Prevencin del parto prematuro Preventing  Preterm Birth Se conoce como parto prematuro al nacimiento del beb entre las semanas 20y37del embarazo. Un embarazo a trmino comprende un mnimo de 37semanas. El parto prematuro puede ser peligroso para el beb, ya que las ltimas semanas de Psychiatrist representan un tiempo importante en el que el cerebro y los pulmones del beb se desarrollan. Muchas cosas pueden hacer que el beb nazca antes de tiempo. Algunas veces la causa no se conoce. Hay ciertos factores que la hacen ms propensa a Airline pilot, por ejemplo:  Production manager tenido un parto prematuro antes.  Estar embarazada de ms de un beb.  Haberse sometido a un tratamiento de fertilidad.  Tener sobrepeso o muy bajo peso al comienzo del Psychiatrist.  Haber experimentado algo de lo siguiente durante el embarazo: ? Una infeccin, incluida una infeccin de las vas urinarias o una infeccin de transmisin sexual (ITS). ? Hipertensin arterial. ? Diabetes. ? Hemorragia vaginal.  Tener 35aos o ms.  Tener 18aos o menos.  Quedar embarazada en el transcurso de los 6 meses posteriores a Nurse, mental health.  Sufrir Psychologist, sport and exercise, o Medical sales representative fsico o Energy manager.  Estar parada durante perodos muy prolongados durante el embarazo, como trabajar en un puesto que lo requiera. Cules son los riesgos? El riesgo ms grave del parto prematuro es que el beb no sobreviva. Es ms probable que esto ocurra si un beb nace antes de las 34semanas. Otros riesgos y complicaciones del parto prematuro podran incluir que el beb tuviera algo de lo siguiente:  Problemas respiratorios.  Dao cerebral que afecta el movimiento y la coordinacin(parlisis cerebral).  Dificultades para alimentarse.  Problemas de visin o audicin.  Infecciones o inflamacin del tubo digestivo(colitis).  Retrasos en el desarrollo.  Dificultades de aprendizaje.  Mayor riesgo de padecer diabetes, enfermedades cardacas y presin arterial alta  en el futuro. Qu puedo hacer para disminuir el riesgo?  Atencin mdica Lo ms importante que puede hacer para disminuir el riesgo de Warehouse manager un parto prematuro es Barista atencin mdica de rutina durante el Psychiatrist (cuidado prenatal). Si el riesgo de Warehouse manager un parto prematuro es alto, la podran derivar a un mdico que se especialice en el control de Insurance underwriter de Chief of Staff (perinatlogo). Tal vez le receten medicamentos para ayudar a Ship broker. Cambios en el estilo de vida Ciertos cambios en el estilo de vida tambin pueden disminuir el riesgo de tener un parto prematuro:  Espere al menos 6 meses despus de un embarazo para volver a Scientist, research (physical sciences).  En la medida de lo posible, planifique quedar [employer].  Antes de Kerry Kass, logre un peso saludable. Si tiene sobrepeso, trabaje con el mdico para adelgazar sin riegos.  No consuma ningn producto que contenga nicotina o tabaco, como cigarrillos y Administrator, Civil Service. Si necesita ayuda para dejar de fumar, consulte al mdico.  No beba alcohol.  No consuma drogas. Dnde encontrar apoyo Para obtener ms ayuda, considere lo siguiente:  Hablar con el mdico.  Hablar con un terapeuta o un asesor de consumo de drogas si necesita ayuda para dejar de hacerlo.  Trabajar con un especialista en alimentacin y nutricin (nutricionista) o un entrenador fsico para Pharmacologist un peso saludable.  Unirse a un grupo de apoyo. Dnde encontrar ms informacin Obtenga ms informacin sobre cmo prevenir un Advice worker en los siguientes sitios:  Centros para el control y la prevencin  de enfermedades(Centers for Disease Control and Prevention, CDC): http://curry.org/  March of Dimes: marchofdimes.org/complications/premature-babies.aspx  Asociacin [place] Aliment del Vanetta Mulders (American Pregnancy Association):  americanpregnancy.org/labor-and-birth/premature-labor Comunquese con un mdico si:  Tiene cualquiera de estos signos de trabajo de parto prematuro antes de las 37semanas: ? Cambio o aumento de la secrecin vaginal. ? Prdida de lquido por la vagina. ? Presin o calambres en la parte inferior del abdomen. ? Dolor de espalda que no se calma o empeora. ? Endurecimiento regular (contracciones) en la parte inferior del abdomen. Resumen  Parto prematuro significa tener al beb durante las semanas 20a37 del [place].  El parto prematuro podra poner en riesgo de padecer problemas fsicos y mentales al beb.  Obtener un buen cuidado prenatal puede ayudar a Ship broker.  Puede disminuir el riesgo de tener un Psychiatrist prematuro realizando ciertos cambios en el estilo de vida. Esta informacin no tiene Theme park manager el consejo del mdico. Asegrese de hacerle al mdico cualquier pregunta que tenga. Document Revised: 04/25/2017 Document Reviewed: 01/30/2016 Elsevier Patient Education  2020 [employer].

## 2020-08-31 NOTE — Progress Notes (Signed)
   LOW-RISK PREGNANCY OFFICE VISIT Patient name: Tabitha [name] MRN [id number]  Date of birth: 16-Aug-1981 Chief Complaint:   Routine Prenatal Visit  History of Present Illness:   Tabitha [name] is a 39 y.o. G2P0010 female at [redacted]w[redacted]d with an Estimated Date of Delivery: 10/13/20 being seen today for ongoing management of a low-risk pregnancy.  Today she reports regular contractions since last week. "I've been having a lot of pain in my belly." Contractions: Irregular. Vag. Bleeding: None.  Movement: Present. denies leaking of fluid. Review of Systems:   Pertinent items are noted in HPI Denies abnormal vaginal discharge w/ itching/odor/irritation, headaches, visual changes, shortness of breath, chest pain, abdominal pain, severe nausea/vomiting, or problems with urination or bowel movements unless otherwise stated above. Pertinent History Reviewed:  Reviewed past medical,surgical, social, obstetrical and family history.  Reviewed problem list, medications and allergies. Physical Assessment:   Vitals:   08/31/20 1014  BP: 105/64  Pulse: 96  Temp: 98.2 F (36.8 C)  Weight: 145 lb 6.4 oz (66 kg)  Body mass index is 29.37 kg/m.        Physical Examination:   General appearance: Well appearing, and in no distress  Mental status: Alert, oriented to person, place, and time  Skin: Warm & dry  Cardiovascular: Normal heart rate noted  Respiratory: Normal respiratory effort, no distress  Abdomen: Soft, gravid, nontender  Pelvic: Cervical exam performed  Dilation: 1 Effacement (%): 60 Station: -2  Extremities: Edema: None  Fetal Status: Fetal Heart Rate (bpm): 150 Fundal Height: 32 cm Movement: Present Presentation: Vertex  No results found for this or any previous visit (from the past 24 hour(s)).  Assessment & Plan:  1) Low-risk pregnancy G2P0010 at [redacted]w[redacted]d with an Estimated Date of Delivery: 10/13/20   2) Supervision of normal first pregnancy, antepartum  - Culture, beta strep  (group b only) in case PTD  - Cervicovaginal ancillary only( [employer])  3) [redacted] weeks gestation of pregnancy   4) Preterm contractions  - betamethasone acetate-betamethasone sodium phosphate (CELESTONE) injection 12 mg - Return to office tomorrow for 2nd injection - Information provided on PTL and preventing preterm birth    Meds:  Meds ordered this encounter  Medications  . betamethasone acetate-betamethasone sodium phosphate (CELESTONE) injection 12 mg   Labs/procedures today: GBS and wet prep  BMZ #1  Plan:  Continue routine obstetrical care  weekly visits  OOW from now until delivery (offered note for work - pt declined)  Reviewed: Preterm labor symptoms and general obstetric precautions including but not limited to vaginal bleeding, contractions, leaking of fluid and fetal movement were reviewed in detail with the patient.  All questions were answered.   Follow-up: No follow-ups on file.  Orders Placed This Encounter  Procedures  . Culture, beta strep (group b only)   Raelyn Mora MSN, CNM 08/31/2020 10:41 AM

## 2020-09-01 ENCOUNTER — Ambulatory Visit (INDEPENDENT_AMBULATORY_CARE_PROVIDER_SITE_OTHER): Payer: Self-pay | Admitting: *Deleted

## 2020-09-01 ENCOUNTER — Other Ambulatory Visit (INDEPENDENT_AMBULATORY_CARE_PROVIDER_SITE_OTHER): Payer: Self-pay | Admitting: Obstetrics and Gynecology

## 2020-09-01 DIAGNOSIS — O4703 False labor before 37 completed weeks of gestation, third trimester: Secondary | ICD-10-CM

## 2020-09-01 DIAGNOSIS — N760 Acute vaginitis: Secondary | ICD-10-CM

## 2020-09-01 DIAGNOSIS — B9689 Other specified bacterial agents as the cause of diseases classified elsewhere: Secondary | ICD-10-CM

## 2020-09-01 DIAGNOSIS — Z3A34 34 weeks gestation of pregnancy: Secondary | ICD-10-CM

## 2020-09-01 DIAGNOSIS — Z3400 Encounter for supervision of normal first pregnancy, unspecified trimester: Secondary | ICD-10-CM

## 2020-09-01 DIAGNOSIS — O4700 False labor before 37 completed weeks of gestation, unspecified trimester: Secondary | ICD-10-CM

## 2020-09-01 DIAGNOSIS — O479 False labor, unspecified: Secondary | ICD-10-CM

## 2020-09-01 LAB — CERVICOVAGINAL ANCILLARY ONLY
Bacterial Vaginitis (gardnerella): POSITIVE — AB
Candida Glabrata: NEGATIVE
Candida Vaginitis: NEGATIVE
Chlamydia: NEGATIVE
Comment: NEGATIVE
Comment: NEGATIVE
Comment: NEGATIVE
Comment: NEGATIVE
Comment: NEGATIVE
Comment: NORMAL
Neisseria Gonorrhea: NEGATIVE
Trichomonas: NEGATIVE

## 2020-09-01 MED ORDER — METRONIDAZOLE 500 MG PO TABS: 500.0000 mg | ORAL_TABLET | Freq: Two times a day (BID) | ORAL | 0 refills | Status: DC

## 2020-09-01 NOTE — Progress Notes (Signed)
TC to patient to notify of lab results of vaginal swab. Rx for Flagyl 500 mg BID x 7 days after meal/snack. She denies any UC's today. Explained that BV can be a cause for PTL. We will continue to have weekly OB visits from here until delivery. All questions were answered and patient verbalized an understanding of the plan of care and agrees.   [employer] Interpreter Angelica [phone] utilized as interpreter for phone call.  Raelyn Mora, CNM

## 2020-09-01 NOTE — Progress Notes (Signed)
   Patient in clinic for Betamethasone 12 mg second dosage. Patient stated she is having contractions every 2-3 hours lasting 20 minutes or less. Advised patient that the office will be closing at noon today and return on Tuesday 09/05/2020, closed on 09/04/20 due to Holiday. If the contractions are very painful/sharp and not ease with rest, warm bath, resting or Tylenol to go to MAU. If she is having 5 or more contractions in one hour to go to MAU.  Clovis Pu, RN

## 2020-09-03 LAB — CULTURE, BETA STREP (GROUP B ONLY): Strep Gp B Culture: POSITIVE — AB

## 2020-09-06 ENCOUNTER — Other Ambulatory Visit: Payer: Self-pay

## 2020-09-06 ENCOUNTER — Ambulatory Visit (INDEPENDENT_AMBULATORY_CARE_PROVIDER_SITE_OTHER): Payer: Self-pay | Admitting: Student

## 2020-09-06 VITALS — BP 111/69 | HR 84 | Wt 148.2 lb

## 2020-09-06 DIAGNOSIS — Z3A34 34 weeks gestation of pregnancy: Secondary | ICD-10-CM

## 2020-09-06 DIAGNOSIS — Z3400 Encounter for supervision of normal first pregnancy, unspecified trimester: Secondary | ICD-10-CM

## 2020-09-06 DIAGNOSIS — Z789 Other specified health status: Secondary | ICD-10-CM

## 2020-09-06 DIAGNOSIS — O26893 Other specified pregnancy related conditions, third trimester: Secondary | ICD-10-CM

## 2020-09-06 DIAGNOSIS — R300 Dysuria: Secondary | ICD-10-CM

## 2020-09-06 DIAGNOSIS — B951 Streptococcus, group B, as the cause of diseases classified elsewhere: Secondary | ICD-10-CM | POA: Insufficient documentation

## 2020-09-06 NOTE — Progress Notes (Signed)
   PRENATAL VISIT NOTE  Subjective:  Tabitha [name] is a 39 y.o. G2P0010 at [redacted]w[redacted]d being seen today for ongoing prenatal care.  She is currently monitored for the following issues for this low-risk pregnancy and has Acne; Insomnia; Trigeminal neuralgia of right side of face; Therapeutic drug monitoring; Supervision of normal first pregnancy, antepartum; Language barrier affecting health care; and Positive GBS test on their problem list.  Patient reports dysuria. Reports burning with urination since Sunday. Took 1 AZO on Monday with some relief but has not continued taking it. Denies n/v, fever, flank pain, or hematuria. .  Contractions: Not present. Vag. Bleeding: None.  Movement: Present. Denies leaking of fluid.   The following portions of the patient's history were reviewed and updated as appropriate: allergies, current medications, past family history, past medical history, past social history, past surgical history and problem list.   Objective:   Vitals:   09/06/20 1042  BP: 111/69  Pulse: 84  Weight: 148 lb 3.2 oz (67.2 kg)    Fetal Status: Fetal Heart Rate (bpm): 146 Fundal Height: 33 cm Movement: Present     General:  Alert, oriented and cooperative. Patient is in no acute distress.  Skin: Skin is warm and dry. No rash noted.   Cardiovascular: Normal heart rate noted  Respiratory: Normal respiratory effort, no problems with respiration noted  Abdomen: Soft, gravid, appropriate for gestational age.  Pain/Pressure: Absent     Pelvic: Cervical exam deferred        Extremities: Normal range of motion.  Edema: None  Mental Status: Normal mood and affect. Normal behavior. Normal judgment and thought content.   Assessment and Plan:  Pregnancy: G2P0010 at [redacted]w[redacted]d 1. Supervision of normal first pregnancy, antepartum   2. Dysuria during pregnancy in third trimester -urine dip with small hemoglobin. Discussed taking AZO TID x 2 days. Will culture urine & treat with abx  accordingly. Discussed warning signs & reasons to return to care.   - POCT Urinalysis Dipstick - Culture, OB Urine  3. Positive GBS test -on previous hospital visit. Patient without drug allergies. Discussed results & treatment during labor  4. Language barrier affecting health care -video Spanish interpreter used for this encounter  5. [redacted] weeks gestation of pregnancy   Preterm labor symptoms and general obstetric precautions including but not limited to vaginal bleeding, contractions, leaking of fluid and fetal movement were reviewed in detail with the patient. Please refer to After Visit Summary for other counseling recommendations.   No follow-ups on file.  Future Appointments  Date Time Provider Department Center  09/20/2020 10:30 AM Raelyn Mora, CNM [facility] None    Judeth Horn, NP

## 2020-09-06 NOTE — Patient Instructions (Signed)
Embarazo e infeccin urinaria Pregnancy and Urinary Tract Infection  Una infeccin urinaria (IU) puede ocurrir en Clinical cytogeneticist de las vas [place]. Estas incluyen los riones, los tubos que [employer] riones con la vejiga (urteres), la vejiga y el tubo por el que se elimina la orina del cuerpo (uretra). Estos rganos fabrican, Buyer, retail y eliminan la orina del organismo. El mdico puede usar otras palabras para describir la infeccin. La IU alta afecta los urteres y a los riones (pielonefritis). La IU baja afecta la vejiga (cistitis) y Geologist, engineering (uretritis). La mayora de las infecciones de las vas urinarias es causada por la presencia de bacterias en la zona genital, alrededor de la entrada de las vas urinarias (uretra). Estas bacterias proliferan y causan irritacin e inflamacin de las vas [place]. Usted tiene ms probabilidades de tener una IU durante el embarazo porque los cambios fsicos y hormonales por los que atraviesa el cuerpo pueden hacer que sea ms fcil que las bacterias ingresen en las vas urinarias. El beb en gestacin tambin hace presin sobre la vejiga y puede afectar el flujo de [place]. Es Glass blower/designer y tratar las IU [employer] embarazo debido al riesgo de complicaciones graves tanto para usted como para el beb. Lowry Ram modo me afecta? Entre los sntomas de una IU, se incluyen los siguientes:  Necesidad inmediata (urgente) de Garment/textile technologist.  Miccin frecuente o eliminacin de pequeas cantidades de orina con frecuencia.  Ardor o dolor al [employer].  Presencia de [employer].  Orina con mal olor u [employer] atpico.  Dificultad para Garment/textile technologist.  Bennie Hind turbia.  Dolor en el abdomen o en la parte inferior de la espalda.  Secrecin vaginal. Tambin puede presentar:  Vmitos o disminucin del apetito.  Confusin.  Irritabilidad o cansancio.  Cristy Hilts.  Diarrea. Cmo afecta esto al beb? Una IU no tratada durante el embarazo podra ocasionar una  infeccin renal o una infeccin generalizada, lo que puede causar problemas de salud que afecten al beb. Algunas de las complicaciones posibles de una IU no tratada son las siguientes:  Dar a luz al beb antes de las 37semanas de Media planner (prematuro).  Tener un beb con bajo peso al nacer.  Presentar presin arterial alta durante el embarazo (preeclampsia).  Tener un nivel bajo de hemoglobina (anemia). Qu puedo hacer para disminuir el riesgo? Para prevenir la IU, haga lo siguiente:  Vaya al bao en cuanto sienta la necesidad de [place]. No contenga la orina durante largos perodos de [place].  Lmpiese siempre desde adelante hacia atrs, en especial despus de defecar. Use cada trozo de papel higinico solo una vez cuando se limpie.  Vace la vejiga despus de Clinical biochemist.  Mantenga la zona genital seca.  Bonnita Nasuti entre 6 y 10vasos de agua por Training and development officer.  No se haga duchas vaginales ni use desodorantes en aerosol. Cmo se trata? El tratamiento de esta afeccin puede incluir lo siguiente:  Antibiticos cuyo uso es seguro durante el Media planner.  Otros medicamentos para tratar causas menos frecuentes de IU. Siga estas instrucciones en su casa:  Si le recetaron un antibitico, tmelo como se lo haya indicado el mdico. No deje de usar el antibitico aunque comience a Sports administrator.  Concurra a todas las visitas de seguimiento como se lo haya indicado el mdico. Esto es importante. Comunquese con un mdico si:  Los sntomas no mejoran o empeoran.  Tiene una secrecin vaginal anormal. Solicite ayuda inmediatamente si:  Tabitha [name].  Tiene nuseas y vmitos.  Siente dolor  en la espalda o en el costado del cuerpo.  Siente contracciones en el tero.  Siente dolor en la parte inferior del abdomen.  Tiene una prdida de lquido abundante por la vagina.  Observa sangre en la orina. Resumen  Una infeccin urinaria (IU) es una infeccin en cualquier parte de las  vas urinarias, que incluyen los riones, los urteres, la vejiga y la uretra.  La mayora de las infecciones de las vas urinarias es causada por la presencia de bacterias en la zona genital, alrededor de la entrada de las vas urinarias (uretra).  Es ms probable presentar una IU durante el embarazo.  Si le recetaron un antibitico, tmelo como se lo haya indicado el mdico. No deje de usar el antibitico aunque comience a sentirse mejor. Esta informacin no tiene como fin reemplazar el consejo del mdico. Asegrese de hacerle al mdico cualquier pregunta que tenga. Document Revised: 01/04/2019 Document Reviewed: 01/04/2019 Elsevier Patient Education  2020 Elsevier Inc.  

## 2020-09-06 NOTE — Progress Notes (Signed)
Pt. Presents for ROB, [redacted]w[redacted]d. Pt. States she is having burning during urination x2 days.

## 2020-09-08 LAB — CULTURE, OB URINE

## 2020-09-08 LAB — URINE CULTURE, OB REFLEX

## 2020-09-20 ENCOUNTER — Ambulatory Visit (INDEPENDENT_AMBULATORY_CARE_PROVIDER_SITE_OTHER): Payer: Self-pay | Admitting: Obstetrics and Gynecology

## 2020-09-20 ENCOUNTER — Other Ambulatory Visit: Payer: Self-pay

## 2020-09-20 VITALS — BP 103/58 | HR 82 | Temp 97.9°F | Wt 151.2 lb

## 2020-09-20 DIAGNOSIS — Z3400 Encounter for supervision of normal first pregnancy, unspecified trimester: Secondary | ICD-10-CM

## 2020-09-20 DIAGNOSIS — Z789 Other specified health status: Secondary | ICD-10-CM

## 2020-09-20 DIAGNOSIS — Z3A36 36 weeks gestation of pregnancy: Secondary | ICD-10-CM

## 2020-09-20 DIAGNOSIS — R8271 Bacteriuria: Secondary | ICD-10-CM

## 2020-09-21 ENCOUNTER — Encounter: Payer: Self-pay | Admitting: Obstetrics and Gynecology

## 2020-09-21 NOTE — Progress Notes (Signed)
   LOW-RISK PREGNANCY OFFICE VISIT Patient name: Tabitha [name] MRN [id number]  Date of birth: [birth year] Chief Complaint:   Routine Prenatal Visit  History of Present Illness:   Tabitha [name] is a 39 y.o. G2P0010 female at [redacted]w[redacted]d with an Estimated Date of Delivery: 10/13/20 being seen today for ongoing management of a low-risk pregnancy.  Today she reports no complaints. Contractions: Irregular. Vag. Bleeding: None.  Movement: Present. denies leaking of fluid. Review of Systems:   Pertinent items are noted in HPI Denies abnormal vaginal discharge w/ itching/odor/irritation, headaches, visual changes, shortness of breath, chest pain, abdominal pain, severe nausea/vomiting, or problems with urination or bowel movements unless otherwise stated above. Pertinent History Reviewed:  Reviewed past medical,surgical, social, obstetrical and family history.  Reviewed problem list, medications and allergies. Physical Assessment:   Vitals:   09/20/20 1042  BP: (!) 103/58  Pulse: 82  Temp: 97.9 F (36.6 C)  Weight: 151 lb 3.2 oz (68.6 kg)  Body mass index is 30.54 kg/m.        Physical Examination:   General appearance: Well appearing, and in no distress  Mental status: Alert, oriented to person, place, and time  Skin: Warm & dry  Cardiovascular: Normal heart rate noted  Respiratory: Normal respiratory effort, no distress  Abdomen: Soft, gravid, nontender  Pelvic: Cervical exam deferred         Extremities: Edema: None  Fetal Status: Fetal Heart Rate (bpm): 154   Movement: Present    No results found for this or any previous visit (from the past 24 hour(s)).  Assessment & Plan:  1) Low-risk pregnancy G2P0010 at 108w6d with an Estimated Date of Delivery: 10/13/20   2) Supervision of normal first pregnancy, antepartum - Discussed to go for labor evaluation if UC;s are every 3-5 mins and stronger - S/P BMZ 9/2 & 9/3  3) GBS bacteriuria - Will need abx in labor  4) Language  barrier affecting health care - [employer] Video [place], Myrlene Broker [phone] used for entire visit   5) [redacted] weeks gestation of pregnancy   Meds: No orders of the defined types were placed in this encounter.  Labs/procedures today: none  Plan:  Continue routine obstetrical care   Reviewed: Preterm labor symptoms and general obstetric precautions including but not limited to vaginal bleeding, contractions, leaking of fluid and fetal movement were reviewed in detail with the patient.  All questions were answered.   Follow-up: Return in about 2 weeks (around 10/04/2020) for Return OB visit.  No orders of the defined types were placed in this encounter.  Raelyn Mora MSN, CNM 09/20/2020

## 2020-09-27 ENCOUNTER — Other Ambulatory Visit: Payer: Self-pay

## 2020-09-27 ENCOUNTER — Ambulatory Visit (INDEPENDENT_AMBULATORY_CARE_PROVIDER_SITE_OTHER): Payer: Self-pay

## 2020-09-27 VITALS — BP 116/71 | HR 64 | Temp 98.0°F | Wt 151.6 lb

## 2020-09-27 DIAGNOSIS — B951 Streptococcus, group B, as the cause of diseases classified elsewhere: Secondary | ICD-10-CM

## 2020-09-27 DIAGNOSIS — O09513 Supervision of elderly primigravida, third trimester: Secondary | ICD-10-CM

## 2020-09-27 DIAGNOSIS — Z758 Other problems related to medical facilities and other health care: Secondary | ICD-10-CM

## 2020-09-27 DIAGNOSIS — Z3400 Encounter for supervision of normal first pregnancy, unspecified trimester: Secondary | ICD-10-CM

## 2020-09-27 DIAGNOSIS — Z789 Other specified health status: Secondary | ICD-10-CM

## 2020-09-27 DIAGNOSIS — O99820 Streptococcus B carrier state complicating pregnancy: Secondary | ICD-10-CM

## 2020-09-27 DIAGNOSIS — O09519 Supervision of elderly primigravida, unspecified trimester: Secondary | ICD-10-CM | POA: Insufficient documentation

## 2020-09-27 DIAGNOSIS — Z3A37 37 weeks gestation of pregnancy: Secondary | ICD-10-CM

## 2020-09-27 NOTE — Progress Notes (Signed)
   PRENATAL VISIT NOTE  Subjective:  Tabitha [name] is a 39 y.o. G2P0010 at [redacted]w[redacted]d being seen today for ongoing prenatal care.  She is currently monitored for the following issues for this high-risk pregnancy and has Acne; Insomnia; Trigeminal neuralgia of right side of face; Therapeutic drug monitoring; Supervision of normal first pregnancy, antepartum; Language barrier affecting health care; and Positive GBS test on their problem list.  Patient reports no complaints.  Contractions: Irregular. Vag. Bleeding: None.  Movement: Present. Denies leaking of fluid.   The following portions of the patient's history were reviewed and updated as appropriate: allergies, current medications, past family history, past medical history, past social history, past surgical history and problem list.   Objective:   Vitals:   09/27/20 0900  BP: 116/71  Pulse: 64  Temp: 98 F (36.7 C)  Weight: 151 lb 9.6 oz (68.8 kg)    Fetal Status: Fetal Heart Rate (bpm): 141 Fundal Height: 37 cm Movement: Present     General:  Alert, oriented and cooperative. Patient is in no acute distress.  Skin: Skin is warm and dry. No rash noted.   Cardiovascular: Normal heart rate noted  Respiratory: Normal respiratory effort, no problems with respiration noted  Abdomen: Soft, gravid, appropriate for gestational age.  Pain/Pressure: Present     Pelvic: Cervical exam deferred        Extremities: Normal range of motion.  Edema: None  Mental Status: Normal mood and affect. Normal behavior. Normal judgment and thought content.   Assessment and Plan:  Pregnancy: G2P0010 at [redacted]w[redacted]d 1. Supervision of normal first pregnancy, antepartum -No complaints, routine care -Anticipatory guidance of next visits reviewed with patient  2. Positive GBS test -Will need prophylaxis in labor  3. Language barrier affecting health care -Interpreter used  Term labor symptoms and general obstetric precautions including but not limited to  vaginal bleeding, contractions, leaking of fluid and fetal movement were reviewed in detail with the patient. Please refer to After Visit Summary for other counseling recommendations.   Return in about 1 week (around 10/04/2020) for Return OB visit.  Future Appointments  Date Time Provider Department Center  10/04/2020 11:10 AM Sharyon Cable, CNM [facility] None  10/12/2020  8:10 AM Raelyn Mora, CNM [facility] None    Rolm Bookbinder, [place]

## 2020-09-29 ENCOUNTER — Inpatient Hospital Stay (HOSPITAL_COMMUNITY)
Admission: AD | Admit: 2020-09-29 | Discharge: 2020-10-02 | DRG: 786 | Disposition: A | Discharge status: Home / Self Care | Payer: Medicaid Other | Attending: Obstetrics and Gynecology | Admitting: Obstetrics and Gynecology

## 2020-09-29 ENCOUNTER — Encounter (HOSPITAL_COMMUNITY): Payer: Self-pay | Admitting: Student

## 2020-09-29 ENCOUNTER — Other Ambulatory Visit: Payer: Self-pay

## 2020-09-29 DIAGNOSIS — O99892 Other specified diseases and conditions complicating childbirth: Secondary | ICD-10-CM | POA: Diagnosis not present

## 2020-09-29 DIAGNOSIS — O368390 Maternal care for abnormalities of the fetal heart rate or rhythm, unspecified trimester, not applicable or unspecified: Secondary | ICD-10-CM | POA: Diagnosis not present

## 2020-09-29 DIAGNOSIS — Z87891 Personal history of nicotine dependence: Secondary | ICD-10-CM

## 2020-09-29 DIAGNOSIS — Z3A38 38 weeks gestation of pregnancy: Secondary | ICD-10-CM | POA: Diagnosis not present

## 2020-09-29 DIAGNOSIS — Z23 Encounter for immunization: Secondary | ICD-10-CM

## 2020-09-29 DIAGNOSIS — O4593 Premature separation of placenta, unspecified, third trimester: Secondary | ICD-10-CM | POA: Diagnosis present

## 2020-09-29 DIAGNOSIS — O4292 Full-term premature rupture of membranes, unspecified as to length of time between rupture and onset of labor: Principal | ICD-10-CM | POA: Diagnosis present

## 2020-09-29 DIAGNOSIS — O99824 Streptococcus B carrier state complicating childbirth: Secondary | ICD-10-CM | POA: Diagnosis present

## 2020-09-29 DIAGNOSIS — O9903 Anemia complicating the puerperium: Secondary | ICD-10-CM | POA: Diagnosis not present

## 2020-09-29 DIAGNOSIS — R000 Tachycardia, unspecified: Secondary | ICD-10-CM | POA: Diagnosis not present

## 2020-09-29 DIAGNOSIS — N939 Abnormal uterine and vaginal bleeding, unspecified: Secondary | ICD-10-CM | POA: Diagnosis present

## 2020-09-29 DIAGNOSIS — Z20822 Contact with and (suspected) exposure to covid-19: Secondary | ICD-10-CM | POA: Diagnosis present

## 2020-09-29 DIAGNOSIS — O26893 Other specified pregnancy related conditions, third trimester: Secondary | ICD-10-CM | POA: Diagnosis present

## 2020-09-29 DIAGNOSIS — D649 Anemia, unspecified: Secondary | ICD-10-CM | POA: Diagnosis not present

## 2020-09-29 LAB — CBC
HCT: 36.7 % (ref 36.0–46.0)
Hemoglobin: 11.1 g/dL — ABNORMAL LOW (ref 12.0–15.0)
MCH: 26.2 pg (ref 26.0–34.0)
MCHC: 30.2 g/dL (ref 30.0–36.0)
MCV: 86.8 fL (ref 80.0–100.0)
Platelets: 236 10*3/uL (ref 150–400)
RBC: 4.23 MIL/uL (ref 3.87–5.11)
RDW: 13.6 % (ref 11.5–15.5)
WBC: 7.4 10*3/uL (ref 4.0–10.5)
nRBC: 0 % (ref 0.0–0.2)

## 2020-09-29 LAB — RESPIRATORY PANEL BY RT PCR (FLU A&B, COVID)
Influenza A by PCR: NEGATIVE
Influenza B by PCR: NEGATIVE
SARS Coronavirus 2 by RT PCR: NEGATIVE

## 2020-09-29 LAB — AMNISURE RUPTURE OF MEMBRANE (ROM) NOT AT ARMC: Amnisure ROM: POSITIVE

## 2020-09-29 MED ORDER — PROMETHAZINE HCL 25 MG/ML IJ SOLN
12.5000 mg | Freq: Once | INTRAMUSCULAR | Status: AC
  Administered 2020-09-29: 12.5 mg via INTRAMUSCULAR
  Filled 2020-09-29: qty 1

## 2020-09-29 MED ORDER — ONDANSETRON HCL 4 MG/2ML IJ SOLN: 4.0000 mg | Freq: Four times a day (QID) | INTRAMUSCULAR | Status: DC | PRN

## 2020-09-29 MED ORDER — LACTATED RINGERS IV SOLN
500.0000 mL | INTRAVENOUS | Status: DC | PRN
  Administered 2020-09-30: 500 mL via INTRAVENOUS

## 2020-09-29 MED ORDER — OXYTOCIN-SODIUM CHLORIDE 30-0.9 UT/500ML-% IV SOLN: 2.5000 [IU]/h | INTRAVENOUS | Status: DC

## 2020-09-29 MED ORDER — ACETAMINOPHEN 325 MG PO TABS: 650.0000 mg | ORAL_TABLET | ORAL | Status: DC | PRN

## 2020-09-29 MED ORDER — OXYCODONE-ACETAMINOPHEN 5-325 MG PO TABS: 1.0000 | ORAL_TABLET | ORAL | Status: DC | PRN

## 2020-09-29 MED ORDER — TERBUTALINE SULFATE 1 MG/ML IJ SOLN: 0.2500 mg | Freq: Once | INTRAMUSCULAR | Status: DC | PRN

## 2020-09-29 MED ORDER — LIDOCAINE HCL (PF) 1 % IJ SOLN: 30.0000 mL | INTRAMUSCULAR | Status: DC | PRN

## 2020-09-29 MED ORDER — SODIUM CHLORIDE 0.9 % IV SOLN
5.0000 10*6.[IU] | Freq: Once | INTRAVENOUS | Status: AC
  Administered 2020-09-29: 5 10*6.[IU] via INTRAVENOUS
  Filled 2020-09-29: qty 5

## 2020-09-29 MED ORDER — OXYCODONE-ACETAMINOPHEN 5-325 MG PO TABS: 2.0000 | ORAL_TABLET | ORAL | Status: DC | PRN

## 2020-09-29 MED ORDER — PENICILLIN G POT IN DEXTROSE 60000 UNIT/ML IV SOLN: 3.0000 10*6.[IU] | INTRAVENOUS | Status: DC

## 2020-09-29 MED ORDER — SODIUM CHLORIDE 0.9 % IV SOLN: 5.0000 10*6.[IU] | Freq: Once | INTRAVENOUS | Status: DC

## 2020-09-29 MED ORDER — PENICILLIN G POT IN DEXTROSE 60000 UNIT/ML IV SOLN
3.0000 10*6.[IU] | INTRAVENOUS | Status: DC
  Administered 2020-09-29 – 2020-09-30 (×5): 3 10*6.[IU] via INTRAVENOUS
  Filled 2020-09-29 (×5): qty 50

## 2020-09-29 MED ORDER — OXYTOCIN BOLUS FROM INFUSION: 333.0000 mL | Freq: Once | INTRAVENOUS | Status: DC

## 2020-09-29 MED ORDER — BUTORPHANOL TARTRATE 1 MG/ML IJ SOLN
1.0000 mg | Freq: Once | INTRAMUSCULAR | Status: AC
  Administered 2020-09-29: 1 mg via INTRAMUSCULAR
  Filled 2020-09-29: qty 1

## 2020-09-29 MED ORDER — LACTATED RINGERS IV SOLN: INTRAVENOUS | Status: DC

## 2020-09-29 MED ORDER — SOD CITRATE-CITRIC ACID 500-334 MG/5ML PO SOLN
30.0000 mL | ORAL | Status: DC | PRN
  Administered 2020-09-30: 30 mL via ORAL
  Filled 2020-09-29: qty 15

## 2020-09-29 NOTE — Progress Notes (Signed)
Patient Tabitha [name] is a 39 y.o. G2P0010  Here for PROM and SOL. Denies decreased fetal movements, denies vaginal bleeding.  Patient currently holding in MAU for bed placement.   Patient identified using two identifiers.  Vital signs noted as stable.  Placenta located  unknown .  US performed to verify presentation.  Fetus is in vertex presentation. There were no active vaginal lesions, and no signs of imminent delivery.   Patient given the opportunity to void prior to the procedure.  NST prior to insertion found to be reactive.  Patient placed in lithotomy position, sterile gloves donned and Foley catheter inserted without difficulty via sterile speculum. NST after procedure reactive and no signs of uterine tachysystole.  There were also no active signs of bleeding.

## 2020-09-29 NOTE — MAU Note (Signed)
Noted water with blood, started yesterday at 5p.m.  Water still coming, wearing pad. blood with mucous. Contracting every .  No recent exam

## 2020-09-29 NOTE — H&P (Signed)
Tabitha [name] is a 39 y.o. female G2P0010 with IUP at [redacted]w[redacted]d by  presenting for contractions and SROM last night at 5 pm.  She reports positive fetal movement. She denies leakage of fluid or vaginal bleeding.  Prenatal History/Complications: PNC at [place].  Pregnancy complications:  - Past Medical History: Past Medical History:  Diagnosis Date  . Breast cyst 2011   [place], [place]  . Chronic headaches 2016   Listed as tension and migranous at different times in old records.  Treated with Amitriptyline  . Cystic acne 2016   Treated with Retin A 0.05% cream in past  . Migraine   . Recurrent UTI 2012 to present   Often difficult to resolve  . Trigeminal neuralgia 2017   Right sided  . Trigeminal neuralgia of right side of face 01/10/2017   Right V3 distribution    Past Surgical History: Past Surgical History:  Procedure Laterality Date  . NO PAST SURGERIES      Obstetrical History: OB History    Gravida  2   Para      Term      Preterm      AB  1   Living        SAB  1   TAB      Ectopic      Multiple      Live Births               Social History: Social History   Socioeconomic History  . Marital status: Married    Spouse name: Franciso Bend  . Number of children: 0  . Years of education: Primary  . Highest education level: 2nd grade  Occupational History  . Occupation: unemployed  Tobacco Use  . Smoking status: Former Smoker    Packs/day: 0.25    Years: 7.00    Pack years: 1.75    Types: Cigarettes  . Smokeless tobacco: Never Used  . Tobacco comment: 4 cigarettes per month  Vaping Use  . Vaping Use: Never used  Substance and Sexual Activity  . Alcohol use: No    Alcohol/week: 0.0 standard drinks    Comment: socially  . Drug use: No  . Sexual activity: Yes    Partners: Male    Birth control/protection: Condom, None  Other Topics Concern  . Not on file  Social History Narrative   Originally from [place]; 2nd grade education    [place]   Lived in [place] since 2.2017   Lives with her boyfriend   Right-handed   Caffeine: none   Social Determinants of Health   Financial Resource Strain:   . Difficulty of Paying Living Expenses: Not on file  Food Insecurity:   . Worried About Programme researcher, broadcasting/film/video in the Last Year: Not on file  . Ran Out of Food in the Last Year: Not on file  Transportation Needs:   . Lack of Transportation (Medical): Not on file  . Lack of Transportation (Non-Medical): Not on file  Physical Activity:   . Days of Exercise per Week: Not on file  . Minutes of Exercise per Session: Not on file  Stress:   . Feeling of Stress : Not on file  Social Connections:   . Frequency of Communication with Friends and Family: Not on file  . Frequency of Social Gatherings with Friends and Family: Not on file  . Attends Religious Services: Not on file  . Active Member of Clubs or Organizations:  Not on file  . Attends Banker Meetings: Not on file  . Marital Status: Not on file    Family History: Family History  Problem Relation Age of Onset  . Hypertension Mother     Allergies: No Known Allergies  Medications Prior to Admission  Medication Sig Dispense Refill Last Dose  . Calcium Carbonate-Vit D-Min (CALCIUM 1200 PO) Take by mouth. 1 daily (Patient not taking: Reported on 06/22/2020)     . Elastic Bandages & Supports (COMFORT FIT MATERNITY SUPP SM) MISC 1 Units by Does not apply route daily as needed. (Patient not taking: Reported on 09/27/2020) 1 each 0   . folic acid (FOLVITE) 1 MG tablet Take 1 tablet (1 mg total) by mouth daily. 30 tablet 11   . metroNIDAZOLE (FLAGYL) 500 MG tablet Take 1 tablet (500 mg total) by mouth 2 (two) times daily. (Patient not taking: Reported on 09/06/2020) 14 tablet 0   . omeprazole (PRILOSEC) 40 MG capsule Take 1 capsule (40 mg total) by mouth daily. 30 capsule 3   . Prenatal Vit-Fe Fumarate-FA (PRENATAL VITAMINS) 28-0.8 MG TABS 1 tab by mouth  daily 30 tablet 11     Review of Systems   Constitutional: Negative for fever and chills Eyes: Negative for visual disturbances Respiratory: Negative for shortness of breath, dyspnea Cardiovascular: Negative for chest pain or palpitations  Gastrointestinal: Negative for vomiting, diarrhea and constipation.  POSITIVE for abdominal pain (contractions) Genitourinary: Negative for dysuria and urgency Musculoskeletal: Negative for back pain, joint pain, myalgias  Neurological: Negative for dizziness and headaches  Blood pressure 115/68, pulse 82, temperature 98.6 F (37 C), temperature source Oral, resp. rate 18, height 4\' 11"  (1.499 m), weight 68.8 kg, last menstrual period 01/07/2020, SpO2 100 %, unknown if currently breastfeeding. General appearance: alert, cooperative and no distress Lungs: normal respiratory effort Heart: regular rate and rhythm Abdomen: soft, non-tender; bowel sounds normal Extremities: Homans sign is negative, no sign of DVT DTR's 2+ Presentation: cephalic by 03/06/2020 Fetal monitoring  Baseline: 150 bpm mod var, present acel, no decels, irregular ctx Uterine activity  irregular     Prenatal labs: ABO, Rh: AB/Positive/-- (06/01 1439) Antibody: Negative (06/01 1439) Rubella: 11.00 (06/01 1439) RPR: Non Reactive (07/21 0845)  HBsAg: Negative (06/01 1439)  HIV: Non Reactive (07/21 0845)  GBS: Positive/-- (09/02 1111)  1 hr Glucola passed Genetic screening  Normal Anatomy [birth year]1987 normal, placenta was anterior but resolved  Prenatal Transfer Tool  Maternal Diabetes: No Genetic Screening: Normal Maternal Ultrasounds/Referrals: Normal Fetal Ultrasounds or other Referrals:  None Maternal Substance Abuse:  No Significant Maternal Medications:  None Significant Maternal Lab Results: Group B Strep positive  Results for orders placed or performed during the hospital encounter of 09/29/20 (from the past 24 hour(s))  Amnisure rupture of membrane (rom)not at [facility]    Collection Time: 09/29/20 11:43 AM  Result Value Ref Range   Amnisure ROM POSITIVE     Assessment: Cayla Wiegand is a 39 y.o. G2P0010 with an IUP at [redacted]w[redacted]d presenting for SROM. Patient does not appear to be in active labor; will need IOL. IOL and GBS orders to be placed by labor team. Labor team made aware of patient; patient currently holding in MAU for open bed.   Plan: #Labor: expectant management #Pain:  Per request #FWB Cat 1 #ID: GBS: pos-will  #MOF:  bottle #MOC: pills #Circ: NA (female)   [redacted]w[redacted]d 09/29/2020, 12:57 PM

## 2020-09-29 NOTE — Progress Notes (Addendum)
Patient ID: Tabitha [name], female   DOB: 06-Apr-1981, 39 y.o.   MRN: [id number] Labor Progress Note Tabitha [name] is a 39 y.o. G2P0010 at [redacted]w[redacted]d presented for PROM and SOL.  S: Doing well. Feeling contractions. States that they have gotten stronger and more frequent after the last few hours. Foley bulb just recently fell out. Does not desire epidural.   O:  BP 115/68 (BP Location: Right Arm)   Pulse 82   Temp 98.6 F (37 C) (Oral)   Resp 18   Ht 4\' 11"  (1.499 m)   Wt 68.8 kg   LMP 01/07/2020 (Exact Date)   SpO2 100%   BMI 30.62 kg/m  FHT: 160 bpm; moderate variability; accelerations present; no decelerations present  CVE: Dilation: 5 Effacement (%): 70 Station: -2 Presentation: Vertex Exam by:: [email or web address], RN   A&P: 39 y.o. G2P0010 [redacted]w[redacted]d  #Labor: Progressing well. S/P foley bulb. Having regular contractions. No need to augment at this time. #Pain: PRN analgesia.  #FWB: cat 1 tracing #GBS positive on PCN   [redacted]w[redacted]d, Medical Student 10:58 PM  Attestation of Supervision of Student:  I confirm that I have verified the information documented in the medical student's note and that I have also personally reperformed the history, physical exam and all medical decision making activities.  I have verified that all services and findings are accurately documented in this student's note; and I agree with management and plan as outlined in the documentation. I have also made any necessary editorial changes.  Norton Blizzard, MD Center for [facility], [facility] 09/29/2020 11:33 PM

## 2020-09-29 NOTE — MAU Provider Note (Addendum)
First Provider Initiated Contact with Patient 09/29/20 1118     S: [name] is a 39 y.o. G2P0010 at [redacted]w[redacted]d  who presents to MAU today complaining of leaking of fluid since 5 pm yesterday. She reports that her water has been leaking overnight; it is clear with pink-tinged mucous. She denies vaginal bleeding. She endorses contractions. She reports normal fetal movement.    O: BP 115/68 (BP Location: Right Arm)   Pulse 82   Temp 98.6 F (37 C) (Oral)   Resp 18   Ht 4\' 11"  (1.499 m)   Wt 68.8 kg   LMP 01/07/2020 (Exact Date)   SpO2 100%   BMI 30.62 kg/m  GENERAL: Well-developed, well-nourished female in no acute distress.  HEAD: Normocephalic, atraumatic.  CHEST: Normal effort of breathing, regular heart rate ABDOMEN: Soft, nontender, gravid PELVIC: Normal external female genitalia. Vagina is pink and rugated. Cervix with normal contour, no lesions. Normal discharge.  No pooling.   Cervical exam: Deferred     Fetal Monitoring: Baseline: 150 Variability: Mod Accelerations: Present Decelerations: Absent Contractions: irregular  No results found for this or any previous visit (from the past 24 hour(s)).  Amnisure POSITIVE A: SIUP at [redacted]w[redacted]d  SROM  P: Admit  [redacted]w[redacted]d, CNM 09/29/2020 11:29 AM

## 2020-09-30 ENCOUNTER — Encounter (HOSPITAL_COMMUNITY)
Admission: AD | Disposition: A | Discharge status: Home / Self Care | Payer: Self-pay | Source: Home / Self Care | Attending: Obstetrics and Gynecology

## 2020-09-30 ENCOUNTER — Encounter (HOSPITAL_COMMUNITY): Payer: Self-pay | Admitting: Student

## 2020-09-30 ENCOUNTER — Inpatient Hospital Stay (HOSPITAL_COMMUNITY): Payer: Medicaid Other | Admitting: Anesthesiology

## 2020-09-30 DIAGNOSIS — Z3A38 38 weeks gestation of pregnancy: Secondary | ICD-10-CM

## 2020-09-30 DIAGNOSIS — O99892 Other specified diseases and conditions complicating childbirth: Secondary | ICD-10-CM

## 2020-09-30 DIAGNOSIS — O368390 Maternal care for abnormalities of the fetal heart rate or rhythm, unspecified trimester, not applicable or unspecified: Secondary | ICD-10-CM | POA: Diagnosis not present

## 2020-09-30 DIAGNOSIS — R000 Tachycardia, unspecified: Secondary | ICD-10-CM

## 2020-09-30 LAB — CBC
HCT: 35 % — ABNORMAL LOW (ref 36.0–46.0)
Hemoglobin: 10.6 g/dL — ABNORMAL LOW (ref 12.0–15.0)
MCH: 26.9 pg (ref 26.0–34.0)
MCHC: 30.3 g/dL (ref 30.0–36.0)
MCV: 88.8 fL (ref 80.0–100.0)
Platelets: 208 10*3/uL (ref 150–400)
RBC: 3.94 MIL/uL (ref 3.87–5.11)
RDW: 13.7 % (ref 11.5–15.5)
WBC: 23 10*3/uL — ABNORMAL HIGH (ref 4.0–10.5)
nRBC: 0 % (ref 0.0–0.2)

## 2020-09-30 LAB — APTT: aPTT: 28 seconds (ref 24–36)

## 2020-09-30 LAB — POCT I-STAT EG7
Acid-base deficit: 6 mmol/L — ABNORMAL HIGH (ref 0.0–2.0)
Bicarbonate: 18.4 mmol/L — ABNORMAL LOW (ref 20.0–28.0)
Calcium, Ion: 1.05 mmol/L — ABNORMAL LOW (ref 1.15–1.40)
HCT: 37 % (ref 36.0–46.0)
Hemoglobin: 12.6 g/dL (ref 12.0–15.0)
O2 Saturation: 61 %
Potassium: 4.6 mmol/L (ref 3.5–5.1)
Sodium: 137 mmol/L (ref 135–145)
TCO2: 19 mmol/L — ABNORMAL LOW (ref 22–32)
pCO2, Ven: 33.3 mmHg — ABNORMAL LOW (ref 44.0–60.0)
pH, Ven: 7.349 (ref 7.250–7.430)
pO2, Ven: 33 mmHg (ref 32.0–45.0)

## 2020-09-30 LAB — PROTIME-INR
INR: 1.1 (ref 0.8–1.2)
Prothrombin Time: 14.1 seconds (ref 11.4–15.2)

## 2020-09-30 LAB — RPR: RPR Ser Ql: NONREACTIVE

## 2020-09-30 LAB — FIBRINOGEN: Fibrinogen: 466 mg/dL (ref 210–475)

## 2020-09-30 LAB — PREPARE RBC (CROSSMATCH)

## 2020-09-30 LAB — ABO/RH: ABO/RH(D): AB POS

## 2020-09-30 SURGERY — Surgical Case
Anesthesia: Epidural

## 2020-09-30 MED ORDER — FENTANYL CITRATE (PF) 100 MCG/2ML IJ SOLN
INTRAMUSCULAR | Status: DC | PRN
Start: 2020-09-30 — End: 2020-09-30
  Administered 2020-09-30: 100 ug via EPIDURAL

## 2020-09-30 MED ORDER — LACTATED RINGERS IV SOLN: INTRAVENOUS | Status: DC | PRN

## 2020-09-30 MED ORDER — FENTANYL-BUPIVACAINE-NACL 0.5-0.125-0.9 MG/250ML-% EP SOLN: 12.0000 mL/h | EPIDURAL | Status: DC | PRN

## 2020-09-30 MED ORDER — OXYTOCIN-SODIUM CHLORIDE 30-0.9 UT/500ML-% IV SOLN
1.0000 m[IU]/min | INTRAVENOUS | Status: DC
  Administered 2020-09-30: 2 m[IU]/min via INTRAVENOUS
  Filled 2020-09-30: qty 500

## 2020-09-30 MED ORDER — KETOROLAC TROMETHAMINE 30 MG/ML IJ SOLN: 30.0000 mg | Freq: Once | INTRAMUSCULAR | Status: DC

## 2020-09-30 MED ORDER — MISOPROSTOL 200 MCG PO TABS
ORAL_TABLET | ORAL | Status: AC
  Filled 2020-09-30: qty 1

## 2020-09-30 MED ORDER — OXYCODONE HCL 5 MG PO TABS
5.0000 mg | ORAL_TABLET | ORAL | Status: DC | PRN
  Administered 2020-10-01: 5 mg via ORAL
  Administered 2020-10-02 (×2): 10 mg via ORAL
  Filled 2020-09-30 (×2): qty 2
  Filled 2020-09-30: qty 1

## 2020-09-30 MED ORDER — ENOXAPARIN SODIUM 40 MG/0.4ML ~~LOC~~ SOLN
40.0000 mg | SUBCUTANEOUS | Status: DC
  Administered 2020-10-01 – 2020-10-02 (×2): 40 mg via SUBCUTANEOUS
  Filled 2020-09-30 (×2): qty 0.4

## 2020-09-30 MED ORDER — METHYLERGONOVINE MALEATE 0.2 MG/ML IJ SOLN
INTRAMUSCULAR | Status: DC | PRN
  Administered 2020-09-30 (×2): .2 mg via INTRAMUSCULAR

## 2020-09-30 MED ORDER — MENTHOL 3 MG MT LOZG: 1.0000 | LOZENGE | OROMUCOSAL | Status: DC | PRN

## 2020-09-30 MED ORDER — DIBUCAINE (PERIANAL) 1 % EX OINT: 1.0000 "application " | TOPICAL_OINTMENT | CUTANEOUS | Status: DC | PRN

## 2020-09-30 MED ORDER — TRANEXAMIC ACID-NACL 1000-0.7 MG/100ML-% IV SOLN
INTRAVENOUS | Status: DC | PRN
  Administered 2020-09-30: 1000 mg via INTRAVENOUS

## 2020-09-30 MED ORDER — METHYLERGONOVINE MALEATE 0.2 MG/ML IJ SOLN
INTRAMUSCULAR | Status: AC
  Filled 2020-09-30: qty 1

## 2020-09-30 MED ORDER — HYDROMORPHONE HCL 1 MG/ML IJ SOLN: 0.2000 mg | INTRAMUSCULAR | Status: DC | PRN

## 2020-09-30 MED ORDER — NALBUPHINE HCL 10 MG/ML IJ SOLN: 5.0000 mg | INTRAMUSCULAR | Status: DC | PRN

## 2020-09-30 MED ORDER — TERBUTALINE SULFATE 1 MG/ML IJ SOLN: 0.2500 mg | Freq: Once | INTRAMUSCULAR | Status: DC | PRN

## 2020-09-30 MED ORDER — EPHEDRINE SULFATE 50 MG/ML IJ SOLN
INTRAMUSCULAR | Status: DC | PRN
  Administered 2020-09-30: 10 mg via INTRAVENOUS
  Administered 2020-09-30: 15 mg via INTRAVENOUS
  Administered 2020-09-30: 10 mg via INTRAVENOUS

## 2020-09-30 MED ORDER — LOPERAMIDE HCL 2 MG PO CAPS
2.0000 mg | ORAL_CAPSULE | Freq: Four times a day (QID) | ORAL | Status: DC | PRN
  Filled 2020-09-30: qty 1

## 2020-09-30 MED ORDER — MEPERIDINE HCL 25 MG/ML IJ SOLN: 6.2500 mg | INTRAMUSCULAR | Status: DC | PRN

## 2020-09-30 MED ORDER — SCOPOLAMINE 1 MG/3DAYS TD PT72
1.0000 | MEDICATED_PATCH | Freq: Once | TRANSDERMAL | Status: DC
  Administered 2020-09-30: 1.5 mg via TRANSDERMAL

## 2020-09-30 MED ORDER — SODIUM CHLORIDE 0.9 % IV SOLN
500.0000 mg | Freq: Once | INTRAVENOUS | Status: AC
  Administered 2020-09-30: 500 mg via INTRAVENOUS

## 2020-09-30 MED ORDER — EPHEDRINE 5 MG/ML INJ: 10.0000 mg | INTRAVENOUS | Status: DC | PRN

## 2020-09-30 MED ORDER — DEXAMETHASONE SODIUM PHOSPHATE 10 MG/ML IJ SOLN
INTRAMUSCULAR | Status: DC | PRN
  Administered 2020-09-30: 5 mg via INTRAVENOUS

## 2020-09-30 MED ORDER — SODIUM CHLORIDE 0.9 % IR SOLN
Status: DC | PRN
  Administered 2020-09-30: 400 mL

## 2020-09-30 MED ORDER — OXYCODONE HCL 5 MG/5ML PO SOLN: 5.0000 mg | Freq: Once | ORAL | Status: DC | PRN

## 2020-09-30 MED ORDER — LACTATED RINGERS IV SOLN
500.0000 mL | Freq: Once | INTRAVENOUS | Status: AC
  Administered 2020-09-30: 500 mL via INTRAVENOUS

## 2020-09-30 MED ORDER — TETANUS-DIPHTH-ACELL PERTUSSIS 5-2.5-18.5 LF-MCG/0.5 IM SUSP: 0.5000 mL | Freq: Once | INTRAMUSCULAR | Status: DC

## 2020-09-30 MED ORDER — SCOPOLAMINE 1 MG/3DAYS TD PT72
MEDICATED_PATCH | TRANSDERMAL | Status: AC
  Filled 2020-09-30: qty 1

## 2020-09-30 MED ORDER — COCONUT OIL OIL: 1.0000 "application " | TOPICAL_OIL | Status: DC | PRN

## 2020-09-30 MED ORDER — CEFAZOLIN SODIUM-DEXTROSE 1-4 GM/50ML-% IV SOLN
1.0000 g | Freq: Three times a day (TID) | INTRAVENOUS | Status: AC
  Administered 2020-09-30 – 2020-10-01 (×3): 1 g via INTRAVENOUS
  Filled 2020-09-30 (×4): qty 50

## 2020-09-30 MED ORDER — CEFAZOLIN SODIUM-DEXTROSE 2-4 GM/100ML-% IV SOLN
2.0000 g | Freq: Once | INTRAVENOUS | Status: AC
  Administered 2020-09-30: 2 g via INTRAVENOUS

## 2020-09-30 MED ORDER — SIMETHICONE 80 MG PO CHEW
80.0000 mg | CHEWABLE_TABLET | ORAL | Status: DC
  Administered 2020-09-30 – 2020-10-01 (×2): 80 mg via ORAL
  Filled 2020-09-30 (×3): qty 1

## 2020-09-30 MED ORDER — KETOROLAC TROMETHAMINE 30 MG/ML IJ SOLN
INTRAMUSCULAR | Status: AC
  Filled 2020-09-30: qty 1

## 2020-09-30 MED ORDER — HYDROMORPHONE HCL 1 MG/ML IJ SOLN: 0.2500 mg | INTRAMUSCULAR | Status: DC | PRN

## 2020-09-30 MED ORDER — PRENATAL MULTIVITAMIN CH
1.0000 | ORAL_TABLET | Freq: Every day | ORAL | Status: DC
  Administered 2020-10-01 – 2020-10-02 (×2): 1 via ORAL
  Filled 2020-09-30 (×2): qty 1

## 2020-09-30 MED ORDER — KETOROLAC TROMETHAMINE 30 MG/ML IJ SOLN: 30.0000 mg | Freq: Four times a day (QID) | INTRAMUSCULAR | Status: AC | PRN

## 2020-09-30 MED ORDER — IBUPROFEN 800 MG PO TABS
800.0000 mg | ORAL_TABLET | Freq: Four times a day (QID) | ORAL | Status: DC
  Administered 2020-10-01 – 2020-10-02 (×4): 800 mg via ORAL
  Filled 2020-09-30 (×4): qty 1

## 2020-09-30 MED ORDER — ALBUMIN HUMAN 5 % IV SOLN: INTRAVENOUS | Status: DC | PRN

## 2020-09-30 MED ORDER — LIDOCAINE HCL (PF) 2 % IJ SOLN
INTRAMUSCULAR | Status: AC
  Filled 2020-09-30: qty 5

## 2020-09-30 MED ORDER — SODIUM CHLORIDE 0.9% IV SOLUTION: Freq: Once | INTRAVENOUS | Status: DC

## 2020-09-30 MED ORDER — OXYTOCIN 10 UNIT/ML IJ SOLN
INTRAMUSCULAR | Status: AC
  Filled 2020-09-30: qty 4

## 2020-09-30 MED ORDER — KETOROLAC TROMETHAMINE 30 MG/ML IJ SOLN
30.0000 mg | Freq: Four times a day (QID) | INTRAMUSCULAR | Status: AC
  Administered 2020-09-30 – 2020-10-01 (×3): 30 mg via INTRAVENOUS
  Filled 2020-09-30 (×3): qty 1

## 2020-09-30 MED ORDER — NALOXONE HCL 0.4 MG/ML IJ SOLN: 0.4000 mg | INTRAMUSCULAR | Status: DC | PRN

## 2020-09-30 MED ORDER — NALOXONE HCL 4 MG/10ML IJ SOLN
1.0000 ug/kg/h | INTRAVENOUS | Status: DC | PRN
  Filled 2020-09-30: qty 5

## 2020-09-30 MED ORDER — SENNOSIDES-DOCUSATE SODIUM 8.6-50 MG PO TABS
2.0000 | ORAL_TABLET | ORAL | Status: DC
  Administered 2020-09-30 – 2020-10-01 (×2): 2 via ORAL
  Filled 2020-09-30 (×3): qty 2

## 2020-09-30 MED ORDER — OXYTOCIN-SODIUM CHLORIDE 30-0.9 UT/500ML-% IV SOLN
INTRAVENOUS | Status: DC | PRN
  Administered 2020-09-30: 500 mL via INTRAVENOUS
  Administered 2020-09-30: 100 mL via INTRAVENOUS

## 2020-09-30 MED ORDER — DIPHENHYDRAMINE HCL 50 MG/ML IJ SOLN: 12.5000 mg | INTRAMUSCULAR | Status: DC | PRN

## 2020-09-30 MED ORDER — PHENYLEPHRINE 40 MCG/ML (10ML) SYRINGE FOR IV PUSH (FOR BLOOD PRESSURE SUPPORT): 80.0000 ug | PREFILLED_SYRINGE | INTRAVENOUS | Status: DC | PRN

## 2020-09-30 MED ORDER — DIPHENOXYLATE-ATROPINE 2.5-0.025 MG PO TABS
2.0000 | ORAL_TABLET | Freq: Once | ORAL | Status: AC
  Administered 2020-09-30: 2 via ORAL

## 2020-09-30 MED ORDER — FENTANYL CITRATE (PF) 100 MCG/2ML IJ SOLN
100.0000 ug | INTRAMUSCULAR | Status: DC | PRN
  Administered 2020-09-30 (×4): 100 ug via INTRAVENOUS
  Filled 2020-09-30 (×4): qty 2

## 2020-09-30 MED ORDER — FENTANYL CITRATE (PF) 100 MCG/2ML IJ SOLN
INTRAMUSCULAR | Status: AC
  Filled 2020-09-30: qty 2

## 2020-09-30 MED ORDER — ACETAMINOPHEN 500 MG PO TABS
1000.0000 mg | ORAL_TABLET | Freq: Four times a day (QID) | ORAL | Status: DC
  Administered 2020-09-30 – 2020-10-02 (×7): 1000 mg via ORAL
  Filled 2020-09-30 (×7): qty 2

## 2020-09-30 MED ORDER — CARBOPROST TROMETHAMINE 250 MCG/ML IM SOLN
INTRAMUSCULAR | Status: DC | PRN
  Administered 2020-09-30: 250 ug via INTRAMUSCULAR

## 2020-09-30 MED ORDER — SIMETHICONE 80 MG PO CHEW: 80.0000 mg | CHEWABLE_TABLET | ORAL | Status: DC | PRN

## 2020-09-30 MED ORDER — ONDANSETRON HCL 4 MG/2ML IJ SOLN: 4.0000 mg | Freq: Three times a day (TID) | INTRAMUSCULAR | Status: DC | PRN

## 2020-09-30 MED ORDER — LIDOCAINE-EPINEPHRINE (PF) 2 %-1:200000 IJ SOLN
INTRAMUSCULAR | Status: DC | PRN
  Administered 2020-09-30 (×2): 5 mL via EPIDURAL
  Administered 2020-09-30: 4 mL via EPIDURAL

## 2020-09-30 MED ORDER — SODIUM CHLORIDE (PF) 0.9 % IJ SOLN
INTRAMUSCULAR | Status: DC | PRN
Start: 2020-09-30 — End: 2020-09-30
  Administered 2020-09-30: 12 mL/h via EPIDURAL

## 2020-09-30 MED ORDER — LIDOCAINE HCL (PF) 1 % IJ SOLN
INTRAMUSCULAR | Status: DC | PRN
  Administered 2020-09-30: 11 mL via EPIDURAL

## 2020-09-30 MED ORDER — ONDANSETRON HCL 4 MG/2ML IJ SOLN
INTRAMUSCULAR | Status: AC
  Filled 2020-09-30: qty 4

## 2020-09-30 MED ORDER — SODIUM CHLORIDE 0.9 % IR SOLN
Status: DC | PRN
  Administered 2020-09-30: 1000 mL

## 2020-09-30 MED ORDER — DIPHENHYDRAMINE HCL 25 MG PO CAPS: 25.0000 mg | ORAL_CAPSULE | Freq: Four times a day (QID) | ORAL | Status: DC | PRN

## 2020-09-30 MED ORDER — ALBUMIN HUMAN 5 % IV SOLN
INTRAVENOUS | Status: AC
  Filled 2020-09-30: qty 250

## 2020-09-30 MED ORDER — SODIUM CHLORIDE 0.9 % IV SOLN
INTRAVENOUS | Status: AC
  Filled 2020-09-30: qty 500

## 2020-09-30 MED ORDER — NALBUPHINE HCL 10 MG/ML IJ SOLN: 5.0000 mg | Freq: Once | INTRAMUSCULAR | Status: DC | PRN

## 2020-09-30 MED ORDER — TRANEXAMIC ACID-NACL 1000-0.7 MG/100ML-% IV SOLN
INTRAVENOUS | Status: AC
  Filled 2020-09-30: qty 100

## 2020-09-30 MED ORDER — SODIUM CHLORIDE 0.9% FLUSH: 3.0000 mL | INTRAVENOUS | Status: DC | PRN

## 2020-09-30 MED ORDER — PHENYLEPHRINE HCL (PRESSORS) 10 MG/ML IV SOLN
INTRAVENOUS | Status: DC | PRN
  Administered 2020-09-30 (×6): 80 ug via INTRAVENOUS

## 2020-09-30 MED ORDER — PHENYLEPHRINE 40 MCG/ML (10ML) SYRINGE FOR IV PUSH (FOR BLOOD PRESSURE SUPPORT)
PREFILLED_SYRINGE | INTRAVENOUS | Status: AC
  Filled 2020-09-30: qty 10

## 2020-09-30 MED ORDER — MISOPROSTOL 200 MCG PO TABS
1000.0000 ug | ORAL_TABLET | Freq: Once | ORAL | Status: AC
  Administered 2020-09-30: 1000 ug via RECTAL

## 2020-09-30 MED ORDER — OXYCODONE HCL 5 MG PO TABS: 5.0000 mg | ORAL_TABLET | Freq: Once | ORAL | Status: DC | PRN

## 2020-09-30 MED ORDER — MORPHINE SULFATE (PF) 0.5 MG/ML IJ SOLN
INTRAMUSCULAR | Status: AC
  Filled 2020-09-30: qty 10

## 2020-09-30 MED ORDER — DIPHENOXYLATE-ATROPINE 2.5-0.025 MG PO TABS
ORAL_TABLET | ORAL | Status: AC
  Filled 2020-09-30: qty 2

## 2020-09-30 MED ORDER — SIMETHICONE 80 MG PO CHEW
80.0000 mg | CHEWABLE_TABLET | Freq: Three times a day (TID) | ORAL | Status: DC
  Administered 2020-10-01 – 2020-10-02 (×3): 80 mg via ORAL
  Filled 2020-09-30 (×4): qty 1

## 2020-09-30 MED ORDER — DIPHENHYDRAMINE HCL 25 MG PO CAPS: 25.0000 mg | ORAL_CAPSULE | ORAL | Status: DC | PRN

## 2020-09-30 MED ORDER — OXYTOCIN-SODIUM CHLORIDE 30-0.9 UT/500ML-% IV SOLN
INTRAVENOUS | Status: AC
  Filled 2020-09-30: qty 500

## 2020-09-30 MED ORDER — OXYTOCIN-SODIUM CHLORIDE 30-0.9 UT/500ML-% IV SOLN: 2.5000 [IU]/h | INTRAVENOUS | Status: AC

## 2020-09-30 MED ORDER — FENTANYL-BUPIVACAINE-NACL 0.5-0.125-0.9 MG/250ML-% EP SOLN
EPIDURAL | Status: AC
Start: 2020-09-30 — End: 2020-09-30
  Filled 2020-09-30: qty 250

## 2020-09-30 MED ORDER — LACTATED RINGERS IV SOLN: INTRAVENOUS | Status: DC

## 2020-09-30 MED ORDER — OXYTOCIN-SODIUM CHLORIDE 30-0.9 UT/500ML-% IV SOLN: INTRAVENOUS | Status: DC | PRN

## 2020-09-30 MED ORDER — LIDOCAINE-EPINEPHRINE (PF) 2 %-1:200000 IJ SOLN: INTRAMUSCULAR | Status: DC | PRN

## 2020-09-30 MED ORDER — CARBOPROST TROMETHAMINE 250 MCG/ML IM SOLN
INTRAMUSCULAR | Status: AC
  Filled 2020-09-30: qty 1

## 2020-09-30 MED ORDER — WITCH HAZEL-GLYCERIN EX PADS: 1.0000 "application " | MEDICATED_PAD | CUTANEOUS | Status: DC | PRN

## 2020-09-30 MED ORDER — MORPHINE SULFATE (PF) 0.5 MG/ML IJ SOLN
INTRAMUSCULAR | Status: DC | PRN
Start: 2020-09-30 — End: 2020-09-30
  Administered 2020-09-30: 3 mg via EPIDURAL

## 2020-09-30 MED ORDER — ONDANSETRON HCL 4 MG/2ML IJ SOLN
INTRAMUSCULAR | Status: DC | PRN
  Administered 2020-09-30: 4 mg via INTRAVENOUS

## 2020-09-30 MED ORDER — KETOROLAC TROMETHAMINE 30 MG/ML IJ SOLN
30.0000 mg | Freq: Four times a day (QID) | INTRAMUSCULAR | Status: AC | PRN
  Administered 2020-09-30: 30 mg via INTRAMUSCULAR

## 2020-09-30 MED ORDER — ONDANSETRON HCL 4 MG/2ML IJ SOLN: 4.0000 mg | Freq: Once | INTRAMUSCULAR | Status: DC | PRN

## 2020-09-30 SURGICAL SUPPLY — 35 items
BALLN POSTPARTUM SOS BAKRI (BALLOONS) ×3
BALLOON POSTPARTUM SOS BAKRI (BALLOONS) ×1 IMPLANT
BENZOIN TINCTURE PRP APPL 2/3 (GAUZE/BANDAGES/DRESSINGS) IMPLANT
CLOSURE WOUND 1/2 X4 (GAUZE/BANDAGES/DRESSINGS)
CLOTH BEACON ORANGE TIMEOUT ST (SAFETY) ×3 IMPLANT
DRSG OPSITE POSTOP 4X10 (GAUZE/BANDAGES/DRESSINGS) ×3 IMPLANT
ELECT REM PT RETURN 9FT ADLT (ELECTROSURGICAL) ×3
ELECTRODE REM PT RTRN 9FT ADLT (ELECTROSURGICAL) ×1 IMPLANT
EXTRACTOR VACUUM KIWI (MISCELLANEOUS) IMPLANT
GLOVE BIOGEL PI IND STRL 7.0 (GLOVE) ×1 IMPLANT
GLOVE BIOGEL PI INDICATOR 7.0 (GLOVE) ×2
GLOVE SURG ORTHO 8.0 STRL STRW (GLOVE) ×3 IMPLANT
GOWN STRL REUS W/TWL LRG LVL3 (GOWN DISPOSABLE) ×6 IMPLANT
KIT ABG SYR 3ML LUER SLIP (SYRINGE) IMPLANT
NEEDLE HYPO 25X5/8 SAFETYGLIDE (NEEDLE) IMPLANT
NS IRRIG 1000ML POUR BTL (IV SOLUTION) ×3 IMPLANT
PACK C SECTION WH (CUSTOM PROCEDURE TRAY) ×3 IMPLANT
PAD ABD 8X10 STRL (GAUZE/BANDAGES/DRESSINGS) ×3 IMPLANT
PAD OB MATERNITY 4.3X12.25 (PERSONAL CARE ITEMS) ×3 IMPLANT
PENCIL SMOKE EVAC W/HOLSTER (ELECTROSURGICAL) ×3 IMPLANT
RTRCTR C-SECT PINK 25CM LRG (MISCELLANEOUS) IMPLANT
SPONGE GAUZE 4X4 12PLY STER LF (GAUZE/BANDAGES/DRESSINGS) ×6 IMPLANT
STAPLER VISISTAT 35W (STAPLE) ×3 IMPLANT
STRIP CLOSURE SKIN 1/2X4 (GAUZE/BANDAGES/DRESSINGS) IMPLANT
SUT MON AB-0 CT1 36 (SUTURE) ×9 IMPLANT
SUT PLAIN 0 NONE (SUTURE) IMPLANT
SUT VIC AB 0 CT1 27 (SUTURE) ×4
SUT VIC AB 0 CT1 27XBRD ANBCTR (SUTURE) ×2 IMPLANT
SUT VIC AB 2-0 CT1 27 (SUTURE) ×2
SUT VIC AB 2-0 CT1 TAPERPNT 27 (SUTURE) ×1 IMPLANT
SUT VIC AB 4-0 KS 27 (SUTURE) ×3 IMPLANT
TAPE MEDIFIX FOAM 3 (GAUZE/BANDAGES/DRESSINGS) ×3 IMPLANT
TOWEL OR 17X24 6PK STRL BLUE (TOWEL DISPOSABLE) ×3 IMPLANT
TRAY FOLEY W/BAG SLVR 14FR LF (SET/KITS/TRAYS/PACK) ×3 IMPLANT
WATER STERILE IRR 1000ML POUR (IV SOLUTION) ×3 IMPLANT

## 2020-09-30 NOTE — Progress Notes (Signed)
Patient ID: Tabitha [name], female   DOB: [birth year], 39 y.o.   MRN: [id number] Labor Progress Note Tabitha [name] is a 39 y.o. G2P0010 at [redacted]w[redacted]d presented s/p PROM.  S: Pt doing well. No concerns at this time.  O:  BP 129/73   Pulse 65   Temp 99.4 F (37.4 C)   Resp 18   Ht 4\' 11"  (1.499 m)   Wt 68.8 kg   LMP 01/07/2020 (Exact Date)   SpO2 100%   BMI 30.62 kg/m  FHT: 150 bpm; moderate variability; accelerations present; no decelerations present Contractions q2-3 minutes  CVE: Dilation: 6.5 Effacement (%): 80 Station: -2 Presentation: Vertex Exam by:: [email or web address], RN  A&P: 39 y.o. G2P0010 [redacted]w[redacted]d presented s/p PROM.  #Labor: Progressing well. S/P foley bulb. Pitocin started at 0206 given spaced out contraction pattern. Will continue to up-titrate pitocin and recheck in 2 hours or sooner as clinically indicated. #Pain: request for IV pain medication at this time #FWB: cat 1 tracing #GBS positive on PCN  [redacted]w[redacted]d, MD 6:12 AM

## 2020-09-30 NOTE — Anesthesia Postprocedure Evaluation (Signed)
Anesthesia Post Note  Patient: Tabitha [name]  Procedure(s) Performed: CESAREAN SECTION     Patient location during evaluation: PACU Anesthesia Type: Epidural Level of consciousness: oriented and awake and alert Pain management: pain level controlled Vital Signs Assessment: post-procedure vital signs reviewed and stable Respiratory status: spontaneous breathing, respiratory function stable and nonlabored ventilation Cardiovascular status: blood pressure returned to baseline and stable Postop Assessment: no headache, no backache, no apparent nausea or vomiting and epidural receding Anesthetic complications: no   No complications documented.  Last Vitals:  Vitals:   09/30/20 2100 09/30/20 2140  BP:  137/76  Pulse: 84 84  Resp: 12 16  Temp: 37.5 C 37.1 C  SpO2: 100% 98%    Last Pain:  Vitals:   09/30/20 2140  TempSrc: Oral  PainSc:    Pain Goal:    LLE Motor Response: Purposeful movement (09/30/20 2100) LLE Sensation: Tingling (09/30/20 2100) RLE Motor Response: Purposeful movement (09/30/20 2100) RLE Sensation: Tingling (09/30/20 2100)        Lucretia Kern

## 2020-09-30 NOTE — Op Note (Signed)
Tabitha [name] PROCEDURE DATE: 09/30/2020  PREOPERATIVE DIAGNOSES: Intrauterine pregnancy at [redacted]w[redacted]d weeks gestation; non-reassuring fetal status  POSTOPERATIVE DIAGNOSES: The same  PROCEDURE: Primary Low Transverse Cesarean Section  SURGEON:  Dr. Mariel Aloe  ASSISTANT:  Mart Piggs, MD  ANESTHESIOLOGY TEAM: Anesthesiologist: Lowella Curb, MD; Lucretia Kern, MD CRNA: Trellis Paganini, CRNA; Rhymer, Doree Fudge, CRNA  INDICATIONS: Tabitha [name] is a 39 y.o. G2P1011 at [redacted]w[redacted]d here for cesarean section secondary to the indications listed under preoperative diagnoses; please see preoperative note for further details.  The risks of surgery were discussed with the patient including but were not limited to: bleeding which may require transfusion or reoperation; infection which may require antibiotics; injury to bowel, bladder, ureters or other surrounding organs; injury to the fetus; need for additional procedures including hysterectomy in the event of a life-threatening hemorrhage; formation of adhesions; placental abnormalities wth subsequent pregnancies; incisional problems; thromboembolic phenomenon and other postoperative/anesthesia complications.  The patient concurred with the proposed plan, giving informed written consent for the procedure.    FINDINGS:  Viable female infant in cephalic presentation.  Apgars 8 and 8.  Clear amniotic fluid.  Intact placenta, three vessel cord.  Normal uterus, fallopian tubes and ovaries bilaterally.  ANESTHESIA: Epidural  INTRAVENOUS FLUIDS: 2400 ml  LR                                          2900 ml total ESTIMATED BLOOD LOSS: 900 ml URINE OUTPUT:  150 ml SPECIMENS: Placenta sent to L&D COMPLICATIONS: Uterine atony  PROCEDURE IN DETAIL:  The patient preoperatively received intravenous antibiotics and had sequential compression devices applied to her lower extremities.  She was then taken to the operating room where the epidural  anesthesia was dosed up to surgical level and was found to be adequate. She was then placed in a dorsal supine position with a leftward tilt, and prepped and draped in a sterile manner.  A foley catheter was placed into her bladder and attached to constant gravity.  After an adequate timeout was performed, a Pfannenstiel skin incision was made with scalpel two fingerbreaths above the pubic symphysis and carried through to the underlying layer of fascia. The fascia was incised in the midline, and this incision was extended bilaterally using the Mayo scissors.  Kocher clamps x 2 were applied to the superior aspect of the fascial incision and the underlying rectus muscles were dissected off bluntly and sharply.  A similar process was carried out on the inferior aspect of the fascial incision. The rectus muscles were separated in the midline and the peritoneum was entered bluntly. The Alexis self-retaining retractor was introduced into the abdominal cavity.  The vesicouterine peritoneum was identified and grasped using smooth pickups.  It was incised and extended laterally using the Metzenbaum scissors.  A bladder flap was then digitally created.  Attention was turned to the lower uterine segment where a low transverse hysterotomy was made with a scalpel and extended bilaterally bluntly.  The infant was successfully delivered, the cord was clamped and cut after one minute, and the infant was handed over to the awaiting neonatology team. Uterine massage was then administered, and the placenta delivered intact with a three-vessel cord. The uterus was then cleared of clots and debris using manual curettage.  The uterine incision was closed with 1-0 monocryl in a running locked fashion, and an  imbricating layer was also placed with 1-0 monocryl.     At this time the uterus was noted to be atonic.  The patient immediately received vigorous fundal massage with minimal effect.  Pt was moderately hypotensive and was becoming  tachycardic.  She received pitocin, 1000 mcg of cytotec, 0.2 mg of methergine x 2 and 0.25 of hemabate.  The patient also received 1 gram of TXA.  There was approximately 300-500 ml of blood on the patient's underpad, but no apparent active bleeding.  Both myself and Dr. Germaine Pomfret attempted to place the Bakri balloon, but her vaginal vault was narrow and we were unable to have successful placement.  At this point, with anesthesia's assistance, the blood pressure and pulse were both improving as was the uterine tone.  The patient was monitored closely and was found to be stable.  Intraop h/h was  12.6 but this is likely spurious.  The retractor was removed.   The fascia was then closed using 0 Vicryl in a running fashion. The skin was closed with staples. The patient tolerated the procedure well. Sponge, instrument and needle counts were correct x 3.  She was taken to the recovery room in stable condition. Currently fibrinogen and coags are pending.  She will have an h/h drawn 2 hours post surgery.  Due to the vaginal manipulation, the pt will receive Ancef 1 gram IV q 8 hours x 3 doses.   Mariel Aloe, MD, FACOG Obstetrician & Gynecologist, [facility] for [facility], [facility]

## 2020-09-30 NOTE — Discharge Summary (Signed)
Postpartum Discharge Summary  Date of Service updated 10/02/2020     Patient Name: Tabitha [name] DOB: 1981-12-24 MRN: [id number]  Date of admission: 09/29/2020 Delivery date:09/30/2020  Delivering provider: Griffin Basil  Date of discharge: 10/02/2020  Admitting diagnosis: Vaginal bleeding [N93.9] Intrauterine pregnancy: [redacted]w[redacted]d    Secondary diagnosis:  Active Problems:   Vaginal bleeding   Cesarean delivery delivered   Postpartum hemorrhage   Non-reassuring fetal heart rate or rhythm affecting mother  Additional problems: none    Discharge diagnosis: Term Pregnancy Delivered and PPH                                              Post partum procedures:n/a Augmentation: Pitocin and IP Foley Complications: Placental Abruption, Hemorrhage>10026mand ROM>24 hours  Hospital course: Onset of Labor With Unplanned C/S   [age].o. yo G2P1011 at 3840w1ds admitted in Latent Labor on 09/29/2020. Patient presented to the MAU 10/1 with reported PROM 9/30 _0 . Patient was admitted at that time and FB was placed. FB dislodged and pitocin initiated. Patient ultimately had repeat late decelerations with contractions and decision was made to proceed to cesarean delivery at that time. The patient went for cesarean section due to Non-Reassuring FHR. Intraoperative course complicated by PPH, refer to op note for further details. Delivery details as follows: Membrane Rupture Time/Date: 5:00 PM ,09/28/2020   Delivery Method:C-Section, Low Transverse  Details of operation can be found in separate operative note. Patient had an uncomplicated postpartum course.  She is ambulating,tolerating a regular diet, passing flatus, and urinating well.  Patient is discharged home in stable condition 10/02/20.  Newborn Data: Birth date:09/30/2020  Birth time:6:36 PM  Gender:Female  Living status:Living  Apgars:8 ,8  WeiMCNOBS:[id number]  Magnesium Sulfate received: No BMZ received: Yes (9/2 and 9/3 preterm  labor) Rhophylac:N/A MMR:N/A T-DaP:Given prenatally Flu: No Transfusion:No  Physical exam  Vitals:   10/01/20 2315 10/01/20 2320 10/02/20 0422 10/02/20 0805  BP:   (!) 100/58 (!) 96/56  Pulse:   73 68  Resp:   18 18  Temp:   98.4 F (36.9 C) 98.4 F (36.9 C)  TempSrc:   Oral Oral  SpO2: 99% 98% 98% 100%  Weight:      Height:       General: alert, cooperative and no distress Lochia: appropriate Uterine Fundus: firm Incision: Healing well with no significant drainage, Dressing is clean, dry, and intact DVT Evaluation: No evidence of DVT seen on physical exam. Negative Homan's sign. Labs: Lab Results  Component Value Date   WBC 21.2 (H) 10/01/2020   HGB 9.0 (L) 10/01/2020   HCT 28.1 (L) 10/01/2020   MCV 85.7 10/01/2020   PLT 206 10/01/2020   CMP Latest Ref Rng & Units 09/30/2020  Glucose 65 - 99 mg/dL -  BUN 6 - 20 mg/dL -  Creatinine 0.57 - 1.00 mg/dL -  Sodium 135 - 145 mmol/L 137  Potassium 3.5 - 5.1 mmol/L 4.6  Chloride 96 - 106 mmol/L -  CO2 20 - 29 mmol/L -  Calcium 8.7 - 10.2 mg/dL -  Total Protein 6.0 - 8.5 g/dL -  Total Bilirubin 0.0 - 1.2 mg/dL -  Alkaline Phos 39 - 117 IU/L -  AST 0 - 40 IU/L -  ALT 0 - 32 IU/L -   Edinburgh Score: No flowsheet  data found.   After visit meds:  Allergies as of 10/02/2020   No Known Allergies     Medication List    STOP taking these medications   CALCIUM 1200 PO   Comfort Fit Maternity Supp Sm Misc   metroNIDAZOLE 500 MG tablet Commonly known as: FLAGYL   omeprazole 40 MG capsule Commonly known as: PRILOSEC     TAKE these medications   ferrous sulfate 325 (65 FE) MG tablet Take 1 tablet (325 mg total) by mouth daily with breakfast. Start taking on: October 03, 5365   folic acid 1 MG tablet Commonly known as: FOLVITE Take 1 tablet (1 mg total) by mouth daily.   ibuprofen 800 MG tablet Commonly known as: ADVIL Take 1 tablet (800 mg total) by mouth every 6 (six) hours.   oxyCODONE 5 MG immediate  release tablet Commonly known as: Oxy IR/ROXICODONE Take 1-2 tablets (5-10 mg total) by mouth every 4 (four) hours as needed for up to 5 days for moderate pain.   Prenatal Vitamins 28-0.8 MG Tabs 1 tab by mouth daily            Discharge Care Instructions  (From admission, onward)         Start     Ordered   10/02/20 0000  Discharge wound care:       Comments: Pt may shower.  Pat or blow dry incision site   10/02/20 1047   10/02/20 0000  If the dressing is still on your incision site when you go home, remove it on the third day after your surgery date. Remove dressing if it begins to fall off, or if it is dirty or damaged before the third day.        10/02/20 1047           Discharge home in stable condition Infant Feeding: Bottle Infant Disposition:home with mother Discharge instruction: per After Visit Summary and Postpartum booklet. Activity: Advance as tolerated. Pelvic rest for 6 weeks.  Diet: routine diet Future Appointments: Future Appointments  Date Time Provider [place]  10/05/2020  1:15 PM Many None  11/09/2020  2:30 PM Laury Deep, CNM [facility] None   Follow up Visit:  [place] [facility] [place] at [facility] Follow up in 4 week(s).   Specialty: Obstetrics and Gynecology Why: postpartum in 4 weeks, pt needs wound check and staple removal on 10/7 Contact information: [address] [place] [address] [phone]               Please schedule this patient for a In person postpartum visit in 4 weeks with the following provider: Any provider. Additional Postpartum F/U:Incision check 1 week  Low risk pregnancy complicated by: none Delivery mode:  C-Section, Low Transverse  Anticipated Birth Control:  OCPs   10/02/2020 Griffin Basil, MD

## 2020-09-30 NOTE — Transfer of Care (Signed)
Immediate Anesthesia Transfer of Care Note  Patient: Tabitha [name]  Procedure(s) Performed: CESAREAN SECTION  Patient Location: PACU  Anesthesia Type:Epidural  Level of Consciousness: awake, alert  and patient cooperative  Airway & Oxygen Therapy: Patient Spontanous Breathing  Post-op Assessment: Report given to RN and Post -op Vital signs reviewed and stable  Post vital signs: Reviewed and stable  Last Vitals:  Vitals Value Taken Time  BP    Temp    Pulse 94 09/30/20 2010  Resp 16 09/30/20 2010  SpO2 99 % 09/30/20 2010  Vitals shown include unvalidated device data.  Last Pain:  Vitals:   09/30/20 1730  TempSrc:   PainSc: 0-No pain         Complications: No complications documented.

## 2020-09-30 NOTE — Progress Notes (Signed)
  Labor Progress Note Tabitha [name] is a 39 y.o. G2P0010 at [redacted]w[redacted]d presented for PROM (9/30 at 1700) S:  Doing well, has started shivering. Nurse notes reduced bleeding.   O:  BP 120/73   Pulse 72   Temp 98.3 F (36.8 C) (Oral)   Resp 18   Ht 4\' 11"  (1.499 m)   Wt 68.8 kg   LMP 01/07/2020 (Exact Date)   SpO2 100%   BMI 30.62 kg/m   Fetal Tracing:  Baseline: 145 Variability: minimal to moderate Accels: none Decels: early decels intermittently with contractions, quick return to baseline; one late decel associated with scalp stimulation, quick return to baseline  Toco: 3-6 minutes   CVE: Dilation: 7 Effacement (%): 90 Station: -1 Presentation: Vertex Exam by:: [place]   A&P: 39 y.o. G2P0010 [redacted]w[redacted]d PROM (9/30 at 1700) #Labor: S/p FB, out at 2230 on 10/1. Pit started 10/2 at 0206. Previously decreased from 8cc/hr to 4cc/hr d/t minimal variability and intermittent variable decels. IUPC placed 10/2 at1400. One late decel with scalp stimulation (quick return to baseline). 300 mL amnioinfusion started 10/2 at 1715. #Pain: CSE #FWB: Cat 2 #GBS positive, PCN, adequate prophylaxis  #Language barrier: Spanish interpreter used every check.   12/2, MD 5:25 PM

## 2020-09-30 NOTE — Progress Notes (Signed)
Labor Progress Note Tabitha [name] is a 39 y.o. G2P0010 at [redacted]w[redacted]d presented for PROM  S:  Patient coping well with contractions and IV pain medication  O:  BP 129/87   Pulse 83   Temp 99.2 F (37.3 C) (Oral)   Resp 18   Ht 4\' 11"  (1.499 m)   Wt 68.8 kg   LMP 01/07/2020 (Exact Date)   SpO2 100%   BMI 30.62 kg/m   Fetal Tracing:  Baseline: 145 Variability: moderate Accels: 15x15 Decels: early  Toco: 1-2   CVE: Dilation: 6 Effacement (%): 80 Station: -2 Presentation: Vertex Exam by:: [email or web address], CNM   A&P: 39 y.o. G2P0010 [redacted]w[redacted]d PROM #Labor: Progressing well. Discussed with patient risks and benefits of AROM for augmentation of labor. Patient agreeable to plan of care. AROM with moderate amount of clear fluid. Patient and FHR tolerated procedure well. Will decrease pitocin by half due to tachysystole #Pain: IV fentanyl #FWB: Cat 1 #GBS positive  [redacted]w[redacted]d, CNM 9:21 AM

## 2020-09-30 NOTE — Progress Notes (Signed)
  Labor Progress Note Tabitha [name] is a 39 y.o. G2P0010 at [redacted]w[redacted]d presented for PROM (9/30 at 1700) S:  Doing well without concerns. Recently had epidural placed. Spanish interpreter present. RN called regarding vaginal bleeding.   O:  BP (!) 107/55   Pulse 64   Temp 98.2 F (36.8 C) (Oral)   Resp 16   Ht 4\' 11"  (1.499 m)   Wt 68.8 kg   LMP 01/07/2020 (Exact Date)   SpO2 100%   BMI 30.62 kg/m   Fetal Tracing:  Baseline: 145 Variability: minimal to moderate Accels: none Decels: early, variable, and late decels intermittently with contractions, quick return to baseline  Toco: 3-6 minutes   CVE: Dilation: 6.5 Effacement (%): 90 Station: -1 Presentation: Vertex Exam by:: [place]   A&P: 39 y.o. G2P0010 [redacted]w[redacted]d PROM (9/30 at 1700) #Labor: S/p FB, out at 2230 on 10/1. Pit started 10/2 at 0206. Previously decreased from 8cc/hr to 4cc/hr d/t minimal variability and intermittent variable decels. Pt now has epidural and is more comfortable. Risk/benefits of IUPC discussed, pt amenable, placed at this exam. Continue to titrate pitocin to adequate contractions. Discussed FHT and plan of care with Dr. 12/2.  #Pain: CSE #FWB: Cat 2 #GBS positive, PCN, adequate prophylaxis  #Language barrier: Spanish interpreter used every check.   Donavan Foil, MD 2:18 PM

## 2020-09-30 NOTE — Anesthesia Procedure Notes (Signed)
Epidural Patient location during procedure: OB Start time: 09/30/2020 11:15 AM End time: 09/30/2020 11:34 AM  Staffing Anesthesiologist: Lowella Curb, MD Performed: anesthesiologist   Preanesthetic Checklist Completed: patient identified, IV checked, site marked, risks and benefits discussed, surgical consent, monitors and equipment checked, pre-op evaluation and timeout performed  Epidural Patient position: sitting Prep: ChloraPrep Patient monitoring: heart rate, cardiac monitor, continuous pulse ox and blood pressure Approach: midline Location: L2-L3 Injection technique: LOR saline  Needle:  Needle type: Tuohy  Needle gauge: 17 G Needle length: 9 cm Needle insertion depth: 5 cm Catheter type: closed end flexible Catheter size: 20 Guage Catheter at skin depth: 9 cm Test dose: negative  Assessment Events: blood not aspirated, injection not painful, no injection resistance, no paresthesia and negative IV test  Additional Notes Reason for block:procedure for pain

## 2020-09-30 NOTE — Anesthesia Preprocedure Evaluation (Signed)
Anesthesia Evaluation  Patient identified by MRN, date of birth, ID band Patient awake    Reviewed: Allergy & Precautions, NPO status , Patient's Chart, lab work & pertinent test results  Airway Mallampati: II  TM Distance: >3 FB Neck ROM: Full    Dental no notable dental hx.    Pulmonary neg pulmonary ROS, former smoker,    Pulmonary exam normal breath sounds clear to auscultation       Cardiovascular negative cardio ROS Normal cardiovascular exam Rhythm:Regular Rate:Normal     Neuro/Psych  Headaches, negative psych ROS   GI/Hepatic negative GI ROS, Neg liver ROS,   Endo/Other  negative endocrine ROS  Renal/GU negative Renal ROS  negative genitourinary   Musculoskeletal negative musculoskeletal ROS (+)   Abdominal   Peds negative pediatric ROS (+)  Hematology negative hematology ROS (+)   Anesthesia Other Findings   Reproductive/Obstetrics (+) Pregnancy                             Anesthesia Physical Anesthesia Plan  ASA: II  Anesthesia Plan: Epidural   Post-op Pain Management:    Induction:   PONV Risk Score and Plan:   Airway Management Planned:   Additional Equipment:   Intra-op Plan:   Post-operative Plan:   Informed Consent:   Plan Discussed with:   Anesthesia Plan Comments:         Anesthesia Quick Evaluation

## 2020-10-01 DIAGNOSIS — D649 Anemia, unspecified: Secondary | ICD-10-CM

## 2020-10-01 DIAGNOSIS — O9903 Anemia complicating the puerperium: Secondary | ICD-10-CM

## 2020-10-01 LAB — CBC
HCT: 32.2 % — ABNORMAL LOW (ref 36.0–46.0)
HCT: 28.1 % — ABNORMAL LOW (ref 36.0–46.0)
Hemoglobin: 10.4 g/dL — ABNORMAL LOW (ref 12.0–15.0)
Hemoglobin: 9 g/dL — ABNORMAL LOW (ref 12.0–15.0)
MCH: 27.7 pg (ref 26.0–34.0)
MCH: 27.4 pg (ref 26.0–34.0)
MCHC: 32.3 g/dL (ref 30.0–36.0)
MCHC: 32 g/dL (ref 30.0–36.0)
MCV: 85.9 fL (ref 80.0–100.0)
MCV: 85.7 fL (ref 80.0–100.0)
Platelets: 226 10*3/uL (ref 150–400)
Platelets: 206 10*3/uL (ref 150–400)
RBC: 3.75 MIL/uL — ABNORMAL LOW (ref 3.87–5.11)
RBC: 3.28 MIL/uL — ABNORMAL LOW (ref 3.87–5.11)
RDW: 13.6 % (ref 11.5–15.5)
RDW: 13.7 % (ref 11.5–15.5)
WBC: 29.2 10*3/uL — ABNORMAL HIGH (ref 4.0–10.5)
WBC: 21.2 10*3/uL — ABNORMAL HIGH (ref 4.0–10.5)
nRBC: 0 % (ref 0.0–0.2)
nRBC: 0 % (ref 0.0–0.2)

## 2020-10-01 MED ORDER — INFLUENZA VAC SPLIT QUAD 0.5 ML IM SUSY
0.5000 mL | PREFILLED_SYRINGE | INTRAMUSCULAR | Status: AC
  Administered 2020-10-02: 0.5 mL via INTRAMUSCULAR
  Filled 2020-10-01: qty 0.5

## 2020-10-01 NOTE — Progress Notes (Signed)
POSTPARTUM PROGRESS NOTE  POD #1  Subjective:  Tabitha [name] is a 39 y.o. G2P1011 s/p primary LTCS at [redacted]w[redacted]d.  She reports she doing well. No acute events overnight.. Pt has not ambulated yet and still has foley catheter. She notes mild dizziness, but really has not been up. Denies nausea or vomiting. She has  passed flatus. Pain is well controlled.  Lochia is light. No hemorrhage or increased bleeding per pt or nursing staff.  Objective: Blood pressure 105/71, pulse 81, temperature 98.6 F (37 C), temperature source Oral, resp. rate 14, height 4\' 11"  (1.499 m), weight 68.8 kg, last menstrual period 01/07/2020, SpO2 99 %, unknown if currently breastfeeding.  Physical Exam:  General: alert, cooperative and no distress Chest: no respiratory distress Heart:regular rate, distal pulses intact Abdomen: soft, nontender,  Uterine Fundus: firm, appropriately tender DVT Evaluation: No calf swelling or tenderness Extremities: minimal edema Skin: warm, dry; incision clean/dry/intact w/ honeycomb dressing in place  Recent Labs    09/30/20 1943 10/01/20 0118  HGB 10.6* 10.4*  HCT 35.0* 32.2*    Assessment/Plan: Tabitha [name] is a 39 y.o. G2P1011 s/p primary LTCS at [redacted]w[redacted]d for nonreassuring fetal tracing.  POD#1 - Doing well, vitals are stable and h/h also is stable; pt had post delivery atony and received multiple uterotonics, no further bleeding noted. Will recheck cbc at 0900 Begin routine postpartum care  LOS: 2 days   [redacted]w[redacted]d, MD, FACOG 10/01/2020, 7:47 AM

## 2020-10-02 ENCOUNTER — Encounter (HOSPITAL_COMMUNITY): Payer: Self-pay | Admitting: Student

## 2020-10-02 ENCOUNTER — Other Ambulatory Visit (HOSPITAL_COMMUNITY): Payer: Self-pay | Admitting: Obstetrics and Gynecology

## 2020-10-02 MED ORDER — SIMETHICONE 80 MG PO CHEW: 80.0000 mg | CHEWABLE_TABLET | Freq: Three times a day (TID) | ORAL | 0 refills | Status: DC | PRN

## 2020-10-02 MED ORDER — OXYCODONE HCL 5 MG PO TABS: 5.0000 mg | ORAL_TABLET | ORAL | 0 refills | Status: DC | PRN

## 2020-10-02 MED ORDER — IBUPROFEN 800 MG PO TABS: 800.0000 mg | ORAL_TABLET | Freq: Four times a day (QID) | ORAL | 2 refills | Status: AC

## 2020-10-02 MED ORDER — FERROUS SULFATE 325 (65 FE) MG PO TABS: 325.0000 mg | ORAL_TABLET | Freq: Every day | ORAL | 2 refills | Status: DC

## 2020-10-02 MED ORDER — FERROUS SULFATE 325 (65 FE) MG PO TABS
325.0000 mg | ORAL_TABLET | Freq: Every day | ORAL | Status: DC
  Administered 2020-10-02: 325 mg via ORAL
  Filled 2020-10-02: qty 1

## 2020-10-02 MED FILL — oxyCODONE HCL 5 MG TABS: 5 | 5 days supply | Qty: 20 | Fill #0

## 2020-10-02 MED FILL — MI-ACID GAS 80 MG TAB CHEW: 80 | 10 days supply | Qty: 30 | Fill #0

## 2020-10-02 MED FILL — IBUPROFEN 800 MG TAB: 800 | 7 days supply | Qty: 30 | Fill #0

## 2020-10-02 MED FILL — FERROUS SULFATE 325 MG TAB: 325 (65 FE) | 60 days supply | Qty: 60 | Fill #0

## 2020-10-02 NOTE — Discharge Instructions (Signed)
Cuidados en el postparto luego de un parto por cesrea Postpartum Care After Cesarean Delivery Lea esta informacin sobre cmo cuidarse desde el momento en que nazca su beb y Tabitha [name] 6 a 12 semanas despus del parto (perodo del posparto). El mdico tambin podr darle indicaciones ms especficas. Comunquese con el mdico si tiene problemas o preguntas. Siga estas indicaciones en su casa: Medicamentos  [employer] de venta libre y los recetados solamente como se lo haya indicado el mdico.  Si le recetaron un antibitico, tmelo como se lo haya indicado el mdico. No deje de tomar el antibitico aunque comience a sentirse mejor.  Pregntele al mdico si el medicamento recetado: ? Hace que sea necesario que evite conducir o usar maquinaria pesada. ? Puede causarle estreimiento. Es posible que deba tomar medidas para prevenir o tratar el estreimiento, por ejemplo:  Beber suficiente lquido como para Pharmacologist la orina de color amarillo plido.  Tomar medicamentos recetados o de [employer].  Consumir alimentos ricos en fibra, como frijoles, cereales integrales, y frutas y verduras frescas.  Limitar su consumo de alimentos ricos en grasa y azcares procesados, como los alimentos fritos o dulces. Actividad  Retome sus actividades normales de a poco segn lo indicado por el mdico.  Evite las actividades que demandan mucho esfuerzo y Engineer, drilling (que son extenuantes) hasta que el mdico se lo autorice. Siempre es ms seguro caminar a un ritmo tranquilo a moderado. Pregntele al mdico qu actividades son seguras para usted. ? No levante objetos que pesen ms que su beb o ms de 10 libras (4,5 kg), como se lo haya indicado el mdico. ? No pase la aspiradora, suba escaleras ni conduzca un vehculo durante el tiempo que le indique el mdico.  De ser posible, pdale a alguien que le brinde ayuda con las tareas domsticas hasta que pueda realizar sus actividades habituales por su  cuenta.  Descanse todo lo que pueda. Trate de descansar o tomar una siesta mientras el beb duerme. Hemorragia vaginal  Es normal tener un poco de hemorragia vaginal (loquios) despus del parto. Use un apsito sanitario para absorber el sangrado vaginal y la secrecin. ? Durante la primera semana despus del parto, la cantidad y el aspecto de los loquios a menudo es similar a las del perodo menstrual. ? Durante las siguientes semanas disminuir gradualmente hasta convertirse en una secrecin seca amarronada o [place]. ? En la [employer], los loquios se detienen [employer] 4 a 6semanas despus del [place]. Los sangrados vaginales pueden variar de mujer a Nurse, learning disability.  Cambie los apsitos sanitarios con frecuencia. Observe si hay cambios en el flujo, como: ? Aumento repentino en el volumen. ? Cambio en el color. ? Cogulos sanguneos grandes.  Si expulsa un cogulo de sangre, gurdelo y llame al mdico para informrselo. No deseche los cogulos de sangre por el inodoro antes de recibir indicaciones del mdico.  No use tampones ni se haga duchas vaginales hasta que el mdico la autorice.  Si no est amamantando, volver a tener su perodo entre 6 y 8 semanas despus del parto. Si est amamantando, puede volver a tener su perodo [place] 8 semanas despus del parto y el momento en que deje de Museum/gallery exhibitions officer. Cuidados perineales   Si su cesrea no fue planeada, y pas por el proceso de [place] de parto y puj antes del nacimiento, podra Surveyor, mining, hinchazn y Associate Professor del tejido que se encuentra entre la abertura de la vagina y el ano (perineo). Tambin pueden  haberle hecho una incisin en el tejido (episiotoma) o el tejido puede haberse desgarrado durante el [place]. Siga las siguientes indicaciones como se lo haya indicado su mdico: ? Mantenga el perineo limpio y Personnel officer se lo haya indicado el mdico. Utilice apsitos o aerosoles analgsicos y Control and instrumentation engineer, como se lo hayan  indicado. ? Si le realizaron una episiotoma o un desgarro vaginal, controle la zona [employer] para detectar signos de infeccin. Est atento a los siguientes signos:  Enrojecimiento, Optician, dispensing.  Lquido o sangre.  Calor.  Pus o mal olor. ? Es posible que le den una botella rociadora para que use en lugar de limpiarse el rea con papel higinico despus de usar el bao. Cuando comience a Barrister's clerk, podr usar la botella rociadora antes de secarse. Asegrese de secarse suavemente. ? Para Engineer, materials causado por una episiotoma, un desgarro vaginal o hemorroides, trate de tomar un bao de asiento tibio 2 o 3 veces por da. Un bao de asiento es un bao de agua tibia que se toma mientras se est sentado. El agua solo debe Adult nurse las caderas y cubrir las nalgas. Cuidado de las [address]  En los [address] despus del parto, las mamas pueden sentirse pesadas, llenas e incmodas (congestin [place]). Tambin puede tener [employer] se escapa de sus senos. El mdico puede sugerirle mtodos para Systems analyst. La congestin mamaria debera desaparecer al cabo de [employer].  Si est amamantando: ? Use un sostn que sujete y ajuste bien sus pechos. ? Mantenga los pezones secos y limpios. Aplquese cremas y [employer] se lo haya indicado el mdico. ? Es posible que deba usar discos de algodn en el sostn para Insurance account manager [employer] se filtre. ? Puede tener contracciones uterinas cada vez que amamante durante varias semanas despus del [place]. Las contracciones uterinas ayudan al tero a Hotel manager a su tamao habitual. ? Si tiene algn problema con la lactancia materna, consulte con su mdico o con un Holiday representative.  Si no est amamantando: ? Evite tocarse las [address], ya que esto puede hacer que produzcan ms [place]. ? Use un sostn que le proporcione el ajuste correcto y compresas fras para reducir la hinchazn. ? No extraiga (saque) [employer]. Esto har que  produzca ms [employer]. Intimidad y sexualidad  Pregntele al mdico cundo puede retomar la actividad sexual. Esto puede depender de lo siguiente: ? Riesgo de sufrir una infeccin. ? Velocidad de cicatrizacin. ? Comodidad y deseo de [employer] sexual.  Despus del parto, puede quedar embarazada incluso si no ha tenido todava su perodo. Si lo desea, hable con el mdico acerca de los mtodos de planificacin familiar o control de la natalidad (mtodos anticonceptivos). Estilo de vida  No consuma ningn producto que contenga nicotina o tabaco, como cigarrillos, cigarrillos electrnicos y tabaco de Theatre manager. Si necesita ayuda para dejar de consumir, consulte al mdico.  No beba alcohol, especialmente si est amamantando. Comida y bebida   Beba suficiente lquido como para Pharmacologist la orina de color amarillo plido.  Coma alimentos ricos en [employer]. Estos pueden ayudarla a prevenir o Educational psychologist. Los alimentos ricos en fibra incluyen los siguientes: ? Panes y cereales integrales. ? Arroz integral. ? Armed forces operational officer. ? Nils Pyle y verduras frescas.  Tome sus vitaminas prenatales hasta la visita de control de posparto o hasta que su mdico le indique que puede dejar de tomarlas. Indicaciones generales  Concurra a todas las visitas de  control para usted y el beb como se lo haya indicado el mdico. La mayora de las mujeres visita al mdico para un control de posparto dentro de las primeras 3 a 6 semanas despus del parto. Comunquese con un mdico si:  Se siente incapaz de controlar los cambios que implica tener un beb recin nacido y esos sentimientos no desaparecen.  Siente tristeza o preocupacin de forma inusual.  Siente dolor en las mamas, o estn duras o enrojecidas.  Tiene fiebre.  Tiene dificultad para retener la orina o para impedir que la orina se escape.  Tiene poco inters o falta de inters en actividades que solan gustarle.  No ha  amamantado para nada y no ha tenido un perodo menstrual durante 12 semanas despus del parto.  Dej de amamantar al beb y no ha tenido su perodo menstrual durante 12 semanas despus de dejar de amamantar.  Tiene preguntas sobre su cuidado o el del beb.  Elimina un cogulo de sangre por la vagina. Solicite ayuda inmediatamente si:  Siente dolor en el pecho.  Presenta dificultad para respirar.  Tiene un dolor repentino e intenso en la pierna.  Tiene dolor intenso o clicos en el abdomen.  Tiene un sangrado tan intenso en la vagina que empapa ms de un apsito sanitario en una hora. El sangrado no debe ser ms abundante que el perodo ms intenso que haya tenido.  Presenta dolor de cabeza intenso.  Se desmaya.  Tiene visin borrosa o manchas en la vista.  Tiene secrecin vaginal con mal olor.  Tiene pensamientos de autolesionarse o de lesionar al beb. Si alguna vez siente que puede lastimarse o lastimar a otras personas, o tiene pensamientos de poner fin a su vida, busque ayuda de inmediato. Puede dirigirse al servicio de emergencias ms cercano o comunicarse con:  El servicio de emergencias de su localidad (911 en [place]  Una lnea de asistencia al suicida y atencin en crisis, como National Suicide Prevention Lifeline (Lnea Nacional de Prevencin del Suicidio), al 1-800-273-8255. Est disponible las 24 horas del da. Resumen  El perodo de tiempo desde el parto y hasta 6 a 12 semanas despus del parto se denomina perodo posparto.  Retome sus actividades normales de a poco segn lo indicado por el mdico.  Concurra a todas las visitas de control para usted y el beb como se lo haya indicado el mdico. Esta informacin no tiene como fin reemplazar el consejo del mdico. Asegrese de hacerle al mdico cualquier pregunta que tenga. Document Revised: 09/16/2018 Document Reviewed: 09/16/2018 Elsevier Patient Education  2020 Elsevier Inc.  

## 2020-10-03 ENCOUNTER — Other Ambulatory Visit (INDEPENDENT_AMBULATORY_CARE_PROVIDER_SITE_OTHER): Payer: Self-pay | Admitting: Obstetrics and Gynecology

## 2020-10-03 LAB — TYPE AND SCREEN
ABO/RH(D): AB POS
Antibody Screen: NEGATIVE
Unit division: 0
Unit division: 0

## 2020-10-03 LAB — BPAM RBC
Blood Product Expiration Date: 202110312359
Blood Product Expiration Date: 202111022359
Unit Type and Rh: 8400
Unit Type and Rh: 8400

## 2020-10-03 MED ORDER — SIMETHICONE 80 MG PO CHEW: 80.0000 mg | CHEWABLE_TABLET | Freq: Four times a day (QID) | ORAL | 0 refills | Status: DC | PRN

## 2020-10-03 NOTE — Progress Notes (Signed)
Simethicone resent

## 2020-10-04 ENCOUNTER — Encounter: Payer: Self-pay | Admitting: Certified Nurse Midwife

## 2020-10-05 ENCOUNTER — Other Ambulatory Visit: Payer: Self-pay

## 2020-10-05 ENCOUNTER — Ambulatory Visit: Payer: Self-pay | Admitting: *Deleted

## 2020-10-05 ENCOUNTER — Other Ambulatory Visit (INDEPENDENT_AMBULATORY_CARE_PROVIDER_SITE_OTHER): Payer: Self-pay | Admitting: Obstetrics and Gynecology

## 2020-10-05 VITALS — BP 124/61 | HR 90 | Temp 98.5°F | Wt 149.4 lb

## 2020-10-05 DIAGNOSIS — Z4889 Encounter for other specified surgical aftercare: Secondary | ICD-10-CM

## 2020-10-05 DIAGNOSIS — G8918 Other acute postprocedural pain: Secondary | ICD-10-CM

## 2020-10-05 DIAGNOSIS — Z4802 Encounter for removal of sutures: Secondary | ICD-10-CM

## 2020-10-05 MED ORDER — OXYCODONE HCL 5 MG PO TABS: 10.0000 mg | ORAL_TABLET | ORAL | 0 refills | Status: DC | PRN

## 2020-10-05 NOTE — Progress Notes (Signed)
More Postoperative pain than usual. Renew Rx for Oxycodone 10 mg every 6 hour for 24 hours then 1 tablet every 4 hours prn.  Raelyn Mora, CNM  10/05/2020 2:02 PM

## 2020-10-05 NOTE — Progress Notes (Signed)
   Subjective:     Tabitha [name] is a 39 y.o. female who presents to the clinic 5 days status post low transverse cesarean for delivery of infant. Eating a regular diet without difficulty. Bowel movements are normal. Pain is controlled with current analgesics. Medications being used: prescription NSAID's including Ibuprofen and narcotic analgesics including Oxycodone.  The following portions of the patient's history were reviewed and updated as appropriate: allergies, current medications, past medical history, past surgical history and problem list.  Review of Systems Pertinent items are noted in HPI.    Objective:    LMP 01/07/2020 (Exact Date)  General:  alert, cooperative and appears stated age  Abdomen: soft, tender to touch  Incision:   healing well, no drainage, no erythema, no hernia, no seroma, no swelling, no dehiscence, incision well approximated     Assessment:    Postoperative course complicated by pain after cesarean delivery . Patient still had honeycomb surgical dressing. Dressing removed, 24 staples in place. Surgical incision cleaned with betadine swabs x 2 and alcohol swabs x 2. Staples removed randomly, with some tenderness with removal. Two steri strips applied to right side of incision and 3 steri strips applied to left side of incision due to small openings of wound after staples were removed. No bleeding, drainage, swelling or severe redness noted.  Plan:    1. Continue any current medications. 2. Wound care discussed. 3. Activity restrictions: no lifting more than 5-10 pounds 4. Anticipated return to work: not applicable. 5. Follow up: 4 weeks for postpartum.   Clovis Pu, RN

## 2020-10-12 ENCOUNTER — Encounter: Payer: Self-pay | Admitting: Obstetrics and Gynecology

## 2020-11-09 ENCOUNTER — Other Ambulatory Visit: Payer: Self-pay

## 2020-11-09 ENCOUNTER — Ambulatory Visit (INDEPENDENT_AMBULATORY_CARE_PROVIDER_SITE_OTHER): Payer: Self-pay | Admitting: Obstetrics and Gynecology

## 2020-11-09 DIAGNOSIS — Z5189 Encounter for other specified aftercare: Secondary | ICD-10-CM

## 2020-11-09 DIAGNOSIS — Z98891 History of uterine scar from previous surgery: Secondary | ICD-10-CM

## 2020-11-09 DIAGNOSIS — Z30011 Encounter for initial prescription of contraceptive pills: Secondary | ICD-10-CM

## 2020-11-09 MED ORDER — NORGESTIMATE-ETH ESTRADIOL 0.25-35 MG-MCG PO TABS: 1.0000 | ORAL_TABLET | Freq: Every day | ORAL | 11 refills | Status: DC

## 2020-11-09 NOTE — Progress Notes (Signed)
Post Partum Visit Note  Tabitha [name] is a 39 y.o. G81P1011 female who presents for a postpartum visit. She is 6 weeks postpartum following a primary cesarean section.  I have fully reviewed the prenatal and intrapartum course. The delivery was at 38.1 gestational weeks.  Anesthesia: epidural. Postpartum course has been uncomplicated. Baby is doing well. Baby is feeding by bottle - Similac with Iron. Bleeding staining only. Bowel function is abnormal: constipation and gas. Pain with bowel movement.. Bladder function is normal. Patient is not sexually active. Contraception method is none. Postpartum depression screening: negative.   The pregnancy intention screening data noted above was reviewed. Potential methods of contraception were discussed. The patient elected to proceed with Oral Contraceptive.    Edinburgh Postnatal Depression Scale - 11/09/20 1425      Edinburgh Postnatal Depression Scale:  In the Past 7 Days   I have been able to laugh and see the funny side of things. 0    I have looked forward with enjoyment to things. 0    I have blamed myself unnecessarily when things went wrong. 0    I have been anxious or worried for no good reason. 0    I have felt scared or panicky for no good reason. 0    Things have been getting on top of me. 0    I have been so unhappy that I have had difficulty sleeping. 0    I have felt sad or miserable. 0    I have been so unhappy that I have been crying. 0    The thought of harming myself has occurred to me. 0    Edinburgh Postnatal Depression Scale Total 0          The following portions of the patient's history were reviewed and updated as appropriate: allergies, current medications, past family history, past medical history, past social history, past surgical history and problem list.  Review of Systems Constitutional: negative Eyes: negative Ears, nose, mouth, throat, and face: negative Respiratory: negative Cardiovascular:  negative Gastrointestinal: negative Genitourinary:negative Integument/breast: positive for yellowish, mucoid drainge from incision Hematologic/lymphatic: negative Musculoskeletal:negative Neurological: negative Behavioral/Psych: negative Endocrine: negative Allergic/Immunologic: negative     Objective:  Last menstrual period 01/07/2020, not currently breastfeeding.  General:  alert, cooperative and no distress   Breasts:  inspection negative, no nipple discharge or bleeding, no masses or nodularity palpable  Lungs: clear to auscultation bilaterally  Heart:  regular rate and rhythm, S1, S2 normal, no murmur, click, rub or gallop  Abdomen: soft, non-tender; bowel sounds normal; no masses,  no organomegaly - incision well healed (see pic embedded)   Vulva:  not evaluated  Vagina: not evaluated  Cervix:  not evaluated  Corpus: not examined  Adnexa:  not evaluated  Rectal Exam: Not performed.          Assessment:  Encounter for postpartum visit - Normal postpartum exam. Pap smear not done at today's visit.  - Message sent to get patient scheduled for BCCCP for free cervical cancer screening  Encounter for initial prescription of contraceptive pills  - Rx for norgestimate-ethinyl estradiol (ORTHO-CYCLEN) 0.25-35 MG-MCG tablet  Encounter for wound care of surgical pin site - Reassurance given of normal wound healing   Plan:   Essential components of care per ACOG recommendations:  1.  Mood and well being: Patient with negative depression screening today. Reviewed local resources for support.  - Patient does not use tobacco.  - hx of drug use?  No    2. Infant care and feeding:  -Patient currently breastmilk feeding? No -Social determinants of health (SDOH) reviewed in EPIC. No concerns  3. Sexuality, contraception and birth spacing - Patient does not want a pregnancy in the next year.  Desired family size is unsure of number of children.  - Reviewed forms of  contraception in tiered fashion. Patient desired oral contraceptives (estrogen/progesterone) today.  Rx for Sprintec sent to pharmacy - Discussed birth spacing of 18 months  4. Sleep and fatigue -Encouraged family/partner/community support of 4 hrs of uninterrupted sleep to help with mood and fatigue  5. Physical Recovery  - Discussed patients delivery and complications - Patient had a primary cesarean delivery, postoperative healing reviewed. Patient expressed understanding - Patient has urinary incontinence? No  - Patient is safe to resume physical and sexual activity  6.  Health Maintenance - Last pap smear done unknown and was unknown. Mammogram at age 76  7. No Chronic Disease - PCP follow up  Raelyn Mora, CNM [facility] [employer], [facility]

## 2020-11-13 ENCOUNTER — Encounter: Payer: Self-pay | Admitting: Obstetrics and Gynecology

## 2020-11-17 ENCOUNTER — Other Ambulatory Visit: Payer: Self-pay

## 2020-11-17 DIAGNOSIS — Z87898 Personal history of other specified conditions: Secondary | ICD-10-CM

## 2020-11-17 NOTE — Addendum Note (Signed)
Addended by: Narda Rutherford on: 11/17/2020 02:40 PM   Modules accepted: Orders

## 2020-12-12 ENCOUNTER — Ambulatory Visit
Admission: RE | Admit: 2020-12-12 | Discharge: 2020-12-12 | Disposition: A | Discharge status: Home / Self Care | Payer: No Typology Code available for payment source | Source: Ambulatory Visit | Attending: Obstetrics and Gynecology | Admitting: Obstetrics and Gynecology

## 2020-12-12 ENCOUNTER — Ambulatory Visit
Admission: RE | Admit: 2020-12-12 | Discharge: 2020-12-12 | Disposition: A | Discharge status: Home / Self Care | Payer: Self-pay | Source: Ambulatory Visit | Attending: Obstetrics and Gynecology | Admitting: Obstetrics and Gynecology

## 2020-12-12 ENCOUNTER — Ambulatory Visit: Payer: Self-pay | Admitting: *Deleted

## 2020-12-12 ENCOUNTER — Other Ambulatory Visit: Payer: Self-pay

## 2020-12-12 VITALS — BP 118/74 | Wt 128.7 lb

## 2020-12-12 DIAGNOSIS — N644 Mastodynia: Secondary | ICD-10-CM

## 2020-12-12 DIAGNOSIS — Z01419 Encounter for gynecological examination (general) (routine) without abnormal findings: Secondary | ICD-10-CM

## 2020-12-12 DIAGNOSIS — Z87898 Personal history of other specified conditions: Secondary | ICD-10-CM

## 2020-12-12 NOTE — Patient Instructions (Addendum)
Explained breast self awareness with [name]. Pap smear completed today. Let her know BCCCP will cover Pap smears and HPV typing every 5 years unless has a history of abnormal Pap smears. Referred patient to the [facility] for a diagnostic mammogram and left breast ultrasound per recommendation. Appointment scheduled Tuesday, December 12, 2020 at 1010. Patient aware of appointment and will be there. Let patient know will follow up with her within the next couple weeks with results of Pap smear by letter or phone. Discussed smoking cessation with patient. Referred to the [facility] Quitline and gave resources to the free smoking cessation classes at [facility]. Tabitha [name] verbalized understanding.  [name], [name], RN 8:18 AM

## 2020-12-12 NOTE — Progress Notes (Signed)
[name] is a 39 y.o. G2P1011 female who presents to [facility] clinic today with complaint of left breast lump x 10 years. Patient stated she initially had three lumps but can only feel one now. Patient complained of left breast pain that comes and goes. Patient rates the pain at a 5 out of 10 and during her menstrual cycle a 9 out of 10. Last mammogram was completed 08/01/2016 that 33-month left breast ultrasound was recommended. Patient stated she has not followed-up.    Pap Smear: Pap smear completed today. Last Pap smear was around 2018 and patient unsure where she had it completed. Patient stated her last Pap smear was normal. Per patient has no history of an abnormal Pap smear. Last Pap smear result is not available in Epic.   Physical exam: Breasts Breasts symmetrical. No skin abnormalities bilateral breasts. No nipple retraction bilateral breasts. No nipple discharge bilateral breasts. No lymphadenopathy. No lumps palpated bilateral breasts. Unable to palpated any lumps in patients area of concern within the left breast. Complained of left outer and upper breast tenderness on exam.       Pelvic/Bimanual Ext Genitalia No lesions, no swelling and no discharge observed on external genitalia.        Vagina Vagina pink and normal texture. No lesions and scant amount of thick white discharge observed in vagina.        Cervix Cervix is present. Cervix pink and of normal texture. No discharge observed.    Uterus Uterus is present and palpable. Uterus in normal position and normal size. Patient complained of tenderness when palpated uterus. Patient stated she had a c-section 09/30/2020 and has had uterine tenderness since.        Adnexae Bilateral ovaries present and palpable. No tenderness on palpation.         Rectovaginal No rectal exam completed today since patient had no rectal complaints. No skin abnormalities observed on exam.     Smoking History: Patient is a current smoker.  Discussed smoking cessation with patient. Referred to the [facility] Quitline and gave resources to the free smoking cessation classes at [facility].   Patient Navigation: Patient education provided. Access to services provided for patient through [place] program. Spanish interpreter Natale Lay from [facility] provided.   Breast and Cervical Cancer Risk Assessment: Patient does not have family history of breast cancer, known genetic mutations, or radiation treatment to the chest before age 54. Patient does not have history of cervical dysplasia, immunocompromised, or DES exposure in-utero.  Risk Assessment    Risk Scores      12/12/2020   Last edited by: Narda Rutherford, LPN   5-year risk: 0.4 %   Lifetime risk: 9.7 %          A: BCCCP exam without pap smear Complaint of left breast lump and pain.  P: Referred patient to the [facility] of [facility] for a diagnostic mammogram and left breast ultrasound per recommendation. Appointment scheduled Tuesday, December 12, 2020 at 1010.  Priscille Heidelberg, RN 12/12/2020 8:18 AM

## 2020-12-14 LAB — CYTOLOGY - PAP
Comment: NEGATIVE
Diagnosis: NEGATIVE
High risk HPV: NEGATIVE

## 2020-12-15 ENCOUNTER — Telehealth: Payer: Self-pay

## 2020-12-15 NOTE — Telephone Encounter (Signed)
Via Delorise Royals, Patient informed negative Pap/HPV results, due for next pap in 5 years. Patient verbalized understanding.

## 2021-03-19 ENCOUNTER — Telehealth: Payer: Self-pay | Admitting: Internal Medicine

## 2021-03-19 MED ORDER — AMITRIPTYLINE HCL 25 MG PO TABS: 25.0000 mg | ORAL_TABLET | Freq: Every day | ORAL | 4 refills | Status: DC

## 2021-03-21 NOTE — Telephone Encounter (Signed)
Spoke with patient and she confirmed that she is still living in [place]. Patient stated that the reason she requested her Rx at a pharmacy in [place] is because is on her way home from work.

## 2021-09-17 ENCOUNTER — Other Ambulatory Visit: Payer: Self-pay

## 2021-09-17 ENCOUNTER — Telehealth: Payer: Self-pay

## 2021-09-17 MED ORDER — AMITRIPTYLINE HCL 25 MG PO TABS
25.0000 mg | ORAL_TABLET | Freq: Every day | ORAL | 0 refills | Status: DC
Start: 2021-09-17 — End: 2021-09-20

## 2021-09-17 NOTE — Telephone Encounter (Signed)
Pt would like a rx refill of amitriptyline sent to [place] [facility]. Has made an appt for Oct 26

## 2021-09-20 MED ORDER — AMITRIPTYLINE HCL 25 MG PO TABS
25.0000 mg | ORAL_TABLET | Freq: Every day | ORAL | 1 refills | Status: DC
Start: 2021-09-20 — End: 2021-10-24

## 2021-09-20 NOTE — Addendum Note (Signed)
Addended by: Marcene Duos on: 09/20/2021 08:14 AM   Modules accepted: Orders

## 2021-10-24 ENCOUNTER — Ambulatory Visit: Payer: Self-pay | Admitting: Internal Medicine

## 2021-10-24 ENCOUNTER — Other Ambulatory Visit: Payer: Self-pay

## 2021-10-24 MED ORDER — AMITRIPTYLINE HCL 25 MG PO TABS
25.0000 mg | ORAL_TABLET | Freq: Every day | ORAL | 1 refills | Status: DC
Start: 2021-10-24 — End: 2021-11-29

## 2021-11-29 ENCOUNTER — Other Ambulatory Visit: Payer: Self-pay

## 2021-11-29 ENCOUNTER — Ambulatory Visit: Payer: Self-pay | Admitting: Internal Medicine

## 2021-11-29 ENCOUNTER — Encounter: Payer: Self-pay | Admitting: Internal Medicine

## 2021-11-29 VITALS — BP 112/68 | HR 72 | Resp 16 | Ht 59.0 in | Wt 119.0 lb

## 2021-11-29 DIAGNOSIS — G4700 Insomnia, unspecified: Secondary | ICD-10-CM

## 2021-11-29 DIAGNOSIS — N6320 Unspecified lump in the left breast, unspecified quadrant: Secondary | ICD-10-CM

## 2021-11-29 DIAGNOSIS — T887XXA Unspecified adverse effect of drug or medicament, initial encounter: Secondary | ICD-10-CM

## 2021-11-29 MED ORDER — AMITRIPTYLINE HCL 25 MG PO TABS
25.0000 mg | ORAL_TABLET | Freq: Every day | ORAL | 11 refills | Status: DC
Start: 2021-11-29 — End: 2022-01-10

## 2021-11-29 MED ORDER — NORETHINDRONE ACET-ETHINYL EST 1-20 MG-MCG PO TABS: 1.0000 | ORAL_TABLET | Freq: Every day | ORAL | 6 refills | Status: DC

## 2021-11-29 NOTE — Progress Notes (Signed)
    Subjective:    Patient ID: Tabitha [name], female   DOB: 02/10/1981, 40 y.o.   MRN: [id number]   HPI  Tildon Husky interprets  Nausea from [facility] all day long.  Taking Ortho Cyclen at bedtime and still with nausea.  Has been taking for about 1 year.  Discussed implant, injection, IUD benefits and possible side effects.   Was on higher does estrogen BCP in [place]--did not do well with those either.  Also, needs amitriptyline refilled for HA prevention.  Current Meds  Medication Sig   amitriptyline (ELAVIL) 25 MG tablet Take 1 tablet (25 mg total) by mouth at bedtime.   ibuprofen (ADVIL) 800 MG tablet Take 1 tablet (800 mg total) by mouth every 6 (six) hours.   norgestimate-ethinyl estradiol (ORTHO-CYCLEN) 0.25-35 MG-MCG tablet Take 1 tablet by mouth daily.   No Known Allergies   Review of Systems    Objective:   BP 112/68 (BP Location: Left Arm, Patient Position: Sitting, Cuff Size: Normal)   Pulse 72   Resp 16   Ht 4\' 11"  (1.499 m)   Wt 119 lb (54 kg)   LMP 10/18/2021   BMI 24.04 kg/m   Physical Exam NAD   Assessment & Plan   BCP switch:  elects to try a lower level estrogen combination pill.  Switching to Loestrin today.  She will give it 4 months and see if improved.  Is considering Depo Progesterone as an alternate after discussion of BC options.  2.  HAs/insomnia:  controlled with nightly dose of low dose amitriptyline--refilled.

## 2021-11-29 NOTE — Patient Instructions (Signed)
Start pill pack first Sunday after period starts Use a condom with intercourse for first month of new birth control use.

## 2021-12-12 ENCOUNTER — Other Ambulatory Visit: Payer: Self-pay | Admitting: Obstetrics and Gynecology

## 2021-12-12 DIAGNOSIS — Z1231 Encounter for screening mammogram for malignant neoplasm of breast: Secondary | ICD-10-CM

## 2022-01-09 ENCOUNTER — Other Ambulatory Visit: Payer: Self-pay | Admitting: Internal Medicine

## 2022-01-24 ENCOUNTER — Ambulatory Visit: Payer: Self-pay | Admitting: *Deleted

## 2022-01-24 ENCOUNTER — Ambulatory Visit
Admission: RE | Admit: 2022-01-24 | Discharge: 2022-01-24 | Disposition: A | Discharge status: Home / Self Care | Payer: No Typology Code available for payment source | Source: Ambulatory Visit | Attending: Obstetrics and Gynecology | Admitting: Obstetrics and Gynecology

## 2022-01-24 ENCOUNTER — Other Ambulatory Visit: Payer: Self-pay

## 2022-01-24 ENCOUNTER — Encounter (INDEPENDENT_AMBULATORY_CARE_PROVIDER_SITE_OTHER): Payer: Self-pay

## 2022-01-24 VITALS — BP 107/72 | Wt 121.5 lb

## 2022-01-24 DIAGNOSIS — Z1231 Encounter for screening mammogram for malignant neoplasm of breast: Secondary | ICD-10-CM

## 2022-01-24 DIAGNOSIS — N6321 Unspecified lump in the left breast, upper outer quadrant: Secondary | ICD-10-CM

## 2022-01-24 DIAGNOSIS — N644 Mastodynia: Secondary | ICD-10-CM

## 2022-01-24 DIAGNOSIS — Z1239 Encounter for other screening for malignant neoplasm of breast: Secondary | ICD-10-CM

## 2022-01-24 NOTE — Patient Instructions (Addendum)
Explained breast self awareness with Lenor Derrick. Patient did not need a Pap smear today due to last Pap smear and HPV typing was 12/12/2020. Let her know BCCCP will cover Pap smears and HPV typing every 5 years unless has a history of abnormal Pap smears. Referred patient to the [facility] for a screening mammogram on mobile unit per recommendation. Appointment scheduled Thursday, January 24, 2022 at 1350. Patient aware of appointment and will be there. Let patient know the Breast Center will follow up with her within the next couple weeks with results of her mammogram by letter or phone. Mathis verbalized understanding.  Quintasia Theroux, Arvil Chaco, RN 1:00 PM

## 2022-01-24 NOTE — Progress Notes (Signed)
Ms. Elberta Lachapelle is a 41 y.o. female who presents to [facility] clinic today with complaint of left outer breast pain and lump since 2017 that was noted on exam 08/01/2016 and 12/12/2020. Patient stated the left breast lump has decreased in size. Patient stated the left breast pain is constant. Patient rates the pain at a 7 out of 10.    Pap Smear: Pap smear not completed today. Last Pap smear was 12/12/2020 at [facility] clinic and was normal with negative HPV. Per patient has no history of an abnormal Pap smear. Last Pap smear result is available in Epic.   Physical exam: Breasts Breasts symmetrical. No skin abnormalities bilateral breasts. No nipple retraction bilateral breasts. No nipple discharge bilateral breasts. No lymphadenopathy. No lumps palpated right breast. Palpated a mobile lump within the left breast at 2 o'clock next to the areola. Complaints of left outer breast tenderness on exam.   MS DIGITAL DIAG TOMO BILAT  Result Date: 12/12/2020 CLINICAL DATA:  Patient for delayed follow-up of probably benign left breast mass. EXAM: DIGITAL DIAGNOSTIC BILATERAL MAMMOGRAM WITH CAD AND TOMO ULTRASOUND LEFT BREAST COMPARISON:  Previous exam(s). ACR Breast Density Category c: The breast tissue is heterogeneously dense, which may obscure small masses. FINDINGS: No concerning masses, calcifications or distortion identified within either breast. Mammographic images were processed with CAD. Targeted ultrasound is performed, showing a stable 7 x 5 x 8 mm oval circumscribed hypoechoic mass left breast 2 o'clock position 1 cm from the nipple, previously 8 x 5 x 10 mm. IMPRESSION: Stable left breast mass compatible with benign process given stability over time. No mammographic evidence for malignancy. RECOMMENDATION: Screening mammogram in one year.(Code:SM-B-01Y) I have discussed the findings and recommendations with the patient. If applicable, a reminder letter will be sent to the patient regarding the next  appointment. BI-RADS CATEGORY  2: Benign. Electronically Signed   By: Annia Belt M.D.   On: 12/12/2020 11:49     Pelvic/Bimanual Pap is not indicated today per BCCCP guidelines.   Smoking History: Patient is a current smoker. Discussed smoking cessation with patient. Referred to the [facility] Quitline.   Patient Navigation: Patient education provided. Access to services provided for patient through [place] program. Spanish interpreter Natale Lay from [facility] provided.   Breast and Cervical Cancer Risk Assessment: Patient does not have family history of breast cancer, known genetic mutations, or radiation treatment to the chest before age 62. Patient does not have history of cervical dysplasia, immunocompromised, or DES exposure in-utero.  Risk Assessment     Risk Scores       01/24/2022 12/12/2020   Last edited by: Meryl Dare, CMA [place], Sherie Demetrius Charity, LPN   5-year risk: 0.5 % 0.4 %   Lifetime risk: 9.7 % 9.7 %            A: BCCCP exam without pap smear Complaint of left breast lump and pain.  P: Referred patient to the [facility] for a screening mammogram on mobile unit per recommendation. Appointment scheduled Thursday, January 24, 2022 at 1350.  Priscille Heidelberg, RN 01/24/2022 1:00 PM

## 2022-04-01 ENCOUNTER — Ambulatory Visit: Payer: Self-pay | Admitting: Internal Medicine

## 2022-06-03 ENCOUNTER — Ambulatory Visit: Payer: Self-pay | Admitting: Internal Medicine

## 2022-06-03 ENCOUNTER — Encounter: Payer: Self-pay | Admitting: Internal Medicine

## 2022-06-03 VITALS — BP 110/76 | HR 76 | Resp 12 | Ht 58.5 in | Wt 125.0 lb

## 2022-06-03 DIAGNOSIS — Z3041 Encounter for surveillance of contraceptive pills: Secondary | ICD-10-CM

## 2022-06-03 DIAGNOSIS — B351 Tinea unguium: Secondary | ICD-10-CM

## 2022-06-03 DIAGNOSIS — Z79899 Other long term (current) drug therapy: Secondary | ICD-10-CM

## 2022-06-03 DIAGNOSIS — B353 Tinea pedis: Secondary | ICD-10-CM | POA: Insufficient documentation

## 2022-06-03 MED ORDER — TERBINAFINE HCL 250 MG PO TABS: ORAL_TABLET | ORAL | 0 refills | Status: DC

## 2022-06-03 MED ORDER — NORETHINDRONE ACET-ETHINYL EST 1-20 MG-MCG PO TABS: 1.0000 | ORAL_TABLET | Freq: Every day | ORAL | 11 refills | Status: DC

## 2022-06-03 NOTE — Progress Notes (Signed)
    Subjective:    Patient ID: Tabitha [name], female   DOB: October 08, 1981, 41 y.o.   MRN: [id number]   HPI  Tildon Husky interprets  Toenails:  Has had problems with toenails of both feet and itching with peeling of posterior feet for about 1 1/2 years.  Has tried what sounds like Lamisil cream for 1 week 3 times daily.  Apparently tried another topical, but cannot recall the name.    2.  Family Planning:  Not clear what happened, but when sent for mammogram in January, her BCPs were switched to Larin FE1/20 at the visit through [facility], though patient does not recognize the name and states she is still taking the original Loestrin Rx without Iron.  Appears the Larin Fe was not sent anywhere.  Doing well with BCPs now--no N, V, or dizziness.   Did clarify later she is still picking up the Loestrin and taking--last filled April 24th, but then the pharmacy states she also picked up a pill pack on April 29th for Larin FE, which makes even less sense.  Again, does not recognize Larin Fe.   No Known Allergies   Review of Systems    Objective:   BP 110/76 (BP Location: Left Arm, Patient Position: Sitting, Cuff Size: Normal)   Pulse 76   Resp 12   Ht 4' 10.5" (1.486 m)   Wt 125 lb (56.7 kg)   LMP 05/12/2022   BMI 25.68 kg/m   Physical Exam NAD Lungs: CTA CV:  RRR without murmur or rub.  Radial pulses normal and equal. Abd:  S NT, No HSM or mass, + BS Feet:  posterior plantar and sides/back of heel with fine flaking and dryness bilaterally.  Great toenails with discoloration and white flaking, top nail gone.  Smaller adjacent toenails with thickening and white/dark discoloration as well.     Assessment & Plan    Family planning:  appears she is taking the plain Loestrin 1/20 and not the other brand containing iron.  Periods are not heavy.  Refilled the Loestrin.  Cancelled all other prescriptions as do not feel getting good info from pharmacy regarding previous Rxs.  2.  Toenail  onychomycosis and tinea pedis:  Topicals for skin care/moisturization.  Terbinafine 250 mg daily for 84 days.  Follow up in 6 and 12 weeks.  Discussed shoe and shower floor care.

## 2022-06-03 NOTE — Patient Instructions (Addendum)
Tea Tree Oil mixed with [place] Foot Cream una o dos veces al dia   For foot and toenail fungus:  Spray your shoes with Lysol or Lotrimin Antifungal spray when you start the medication for your feet.   Re-spray your the shoes you wear with each wearing and allow to dry before wearing again You will need to continue to spray the shoes you have during the infection of your toenails and feet have been thrown out due to wear.  Once your toenails and feet are clear of infection, the shoes you buy new do not necessarily need to be sprayed. Clean your shower floor once to twice daily with bleach containing cleaner.

## 2022-06-04 LAB — CBC WITH DIFFERENTIAL/PLATELET
Basophils Absolute: 0 10*3/uL (ref 0.0–0.2)
Basos: 1 %
EOS (ABSOLUTE): 0 10*3/uL (ref 0.0–0.4)
Eos: 0 %
Hematocrit: 40.3 % (ref 34.0–46.6)
Hemoglobin: 13.4 g/dL (ref 11.1–15.9)
Immature Grans (Abs): 0 10*3/uL (ref 0.0–0.1)
Immature Granulocytes: 0 %
Lymphocytes Absolute: 1.4 10*3/uL (ref 0.7–3.1)
Lymphs: 28 %
MCH: 30.4 pg (ref 26.6–33.0)
MCHC: 33.3 g/dL (ref 31.5–35.7)
MCV: 91 fL (ref 79–97)
Monocytes Absolute: 0.2 10*3/uL (ref 0.1–0.9)
Monocytes: 4 %
Neutrophils Absolute: 3.4 10*3/uL (ref 1.4–7.0)
Neutrophils: 67 %
Platelets: 306 10*3/uL (ref 150–450)
RBC: 4.41 x10E6/uL (ref 3.77–5.28)
RDW: 12.2 % (ref 11.7–15.4)
WBC: 5 10*3/uL (ref 3.4–10.8)

## 2022-06-04 LAB — COMPREHENSIVE METABOLIC PANEL
ALT: 16 IU/L (ref 0–32)
AST: 18 IU/L (ref 0–40)
Albumin/Globulin Ratio: 1.5 (ref 1.2–2.2)
Albumin: 4.3 g/dL (ref 3.8–4.8)
Alkaline Phosphatase: 47 IU/L (ref 44–121)
BUN/Creatinine Ratio: 12 (ref 9–23)
BUN: 9 mg/dL (ref 6–24)
Bilirubin Total: 0.2 mg/dL (ref 0.0–1.2)
CO2: 22 mmol/L (ref 20–29)
Calcium: 8.9 mg/dL (ref 8.7–10.2)
Chloride: 107 mmol/L — ABNORMAL HIGH (ref 96–106)
Creatinine, Ser: 0.76 mg/dL (ref 0.57–1.00)
Globulin, Total: 2.8 g/dL (ref 1.5–4.5)
Glucose: 84 mg/dL (ref 70–99)
Potassium: 3.8 mmol/L (ref 3.5–5.2)
Sodium: 141 mmol/L (ref 134–144)
Total Protein: 7.1 g/dL (ref 6.0–8.5)
eGFR: 101 mL/min/{1.73_m2} (ref >59)

## 2022-07-16 ENCOUNTER — Ambulatory Visit: Payer: Self-pay | Admitting: Internal Medicine

## 2022-07-18 ENCOUNTER — Other Ambulatory Visit: Payer: Self-pay

## 2022-07-18 DIAGNOSIS — Z79899 Other long term (current) drug therapy: Secondary | ICD-10-CM

## 2022-07-19 LAB — HEPATIC FUNCTION PANEL
ALT: 13 IU/L (ref 0–32)
AST: 15 IU/L (ref 0–40)
Albumin: 4 g/dL (ref 3.9–4.9)
Alkaline Phosphatase: 58 IU/L (ref 44–121)
Bilirubin Total: 0.4 mg/dL (ref 0.0–1.2)
Bilirubin, Direct: 0.11 mg/dL (ref 0.00–0.40)
Total Protein: 6.8 g/dL (ref 6.0–8.5)

## 2022-08-29 ENCOUNTER — Other Ambulatory Visit: Payer: Self-pay

## 2022-08-29 DIAGNOSIS — B351 Tinea unguium: Secondary | ICD-10-CM

## 2022-08-30 LAB — HEPATIC FUNCTION PANEL
ALT: 22 IU/L (ref 0–32)
AST: 23 IU/L (ref 0–40)
Albumin: 4.1 g/dL (ref 3.9–4.9)
Alkaline Phosphatase: 44 IU/L (ref 44–121)
Bilirubin Total: 0.3 mg/dL (ref 0.0–1.2)
Bilirubin, Direct: 0.1 mg/dL (ref 0.00–0.40)
Total Protein: 6.9 g/dL (ref 6.0–8.5)

## 2022-08-30 NOTE — Progress Notes (Signed)
Toenails with very limited abnormality at distal edge now. She continues to spray shoes and clean shower floors

## 2022-12-04 ENCOUNTER — Encounter: Payer: Self-pay | Admitting: Internal Medicine

## 2022-12-10 IMAGING — MG MM DIGITAL SCREENING BILAT W/ TOMO AND CAD
8 series · 9 of 24 positions shown · non-contrast
Comparison: Previous exam(s).

CLINICAL DATA: Screening.

EXAM:
DIGITAL SCREENING BILATERAL MAMMOGRAM WITH TOMOSYNTHESIS AND CAD
TECHNIQUE: Bilateral screening digital craniocaudal and mediolateral oblique
mammograms were obtained. Bilateral screening digital breast
tomosynthesis was performed. The images were evaluated with
computer-aided detection.

[R CC synth-2D]
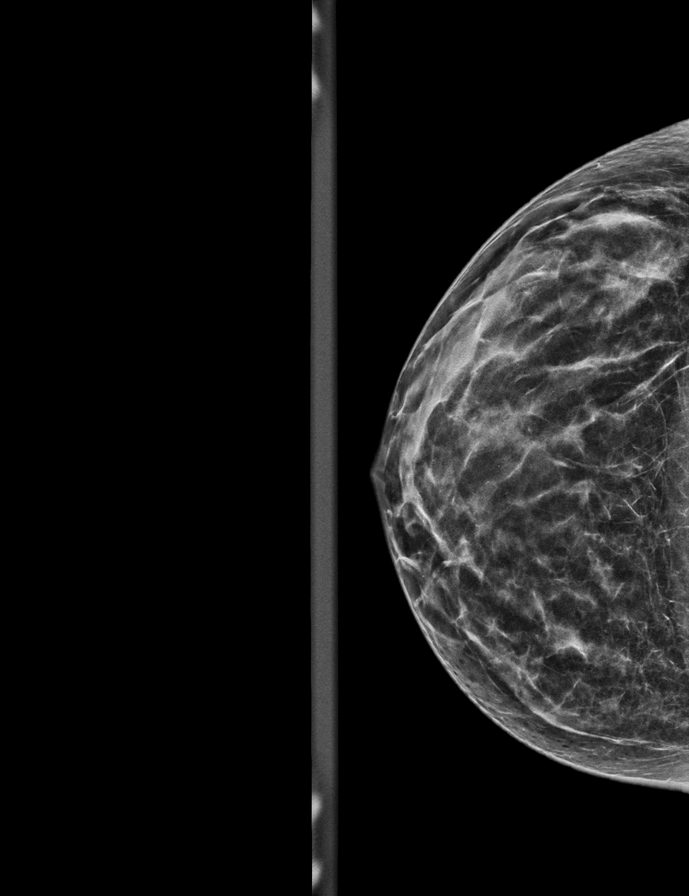

[L CC synth-2D]
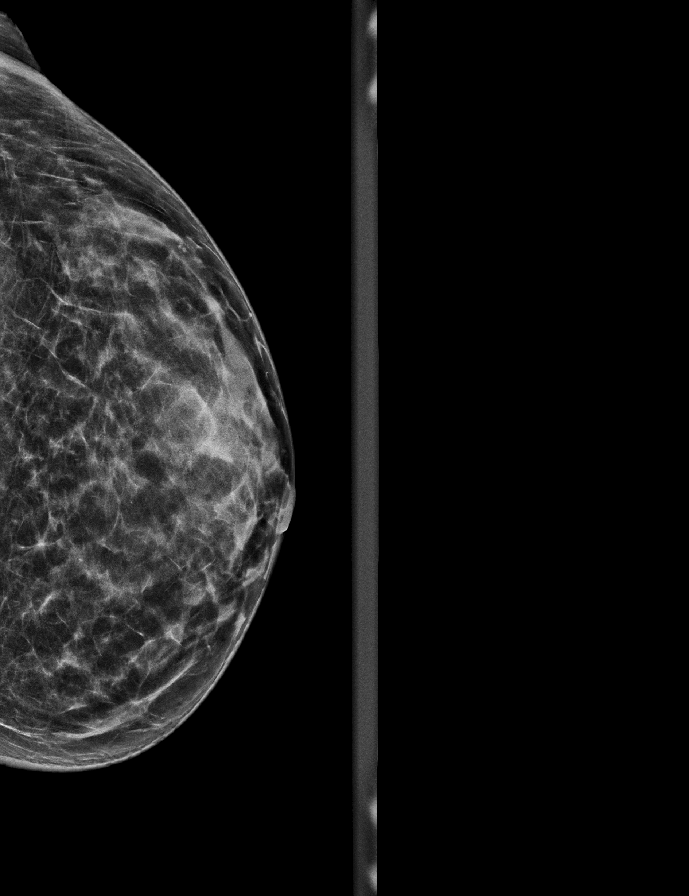

[R MLO synth-2D]
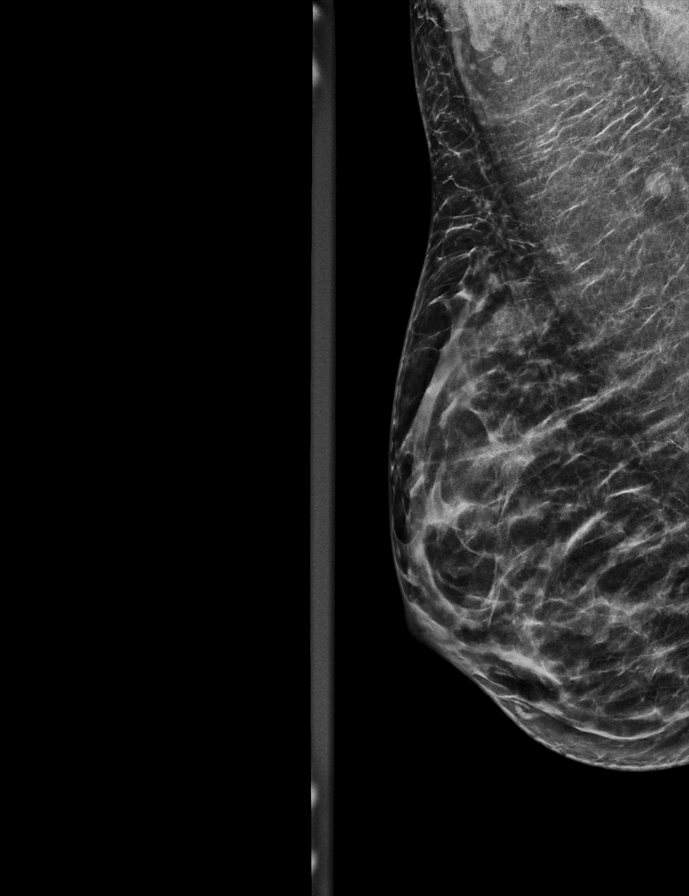

[L MLO synth-2D]
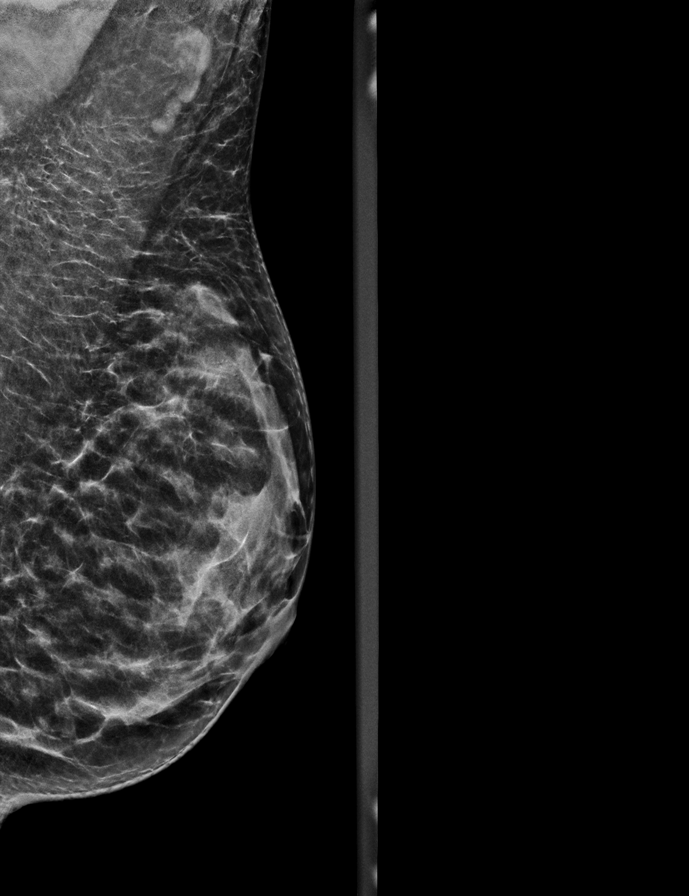

[L CC tomo · 2 of 51 frames shown]
[frame 17/51]
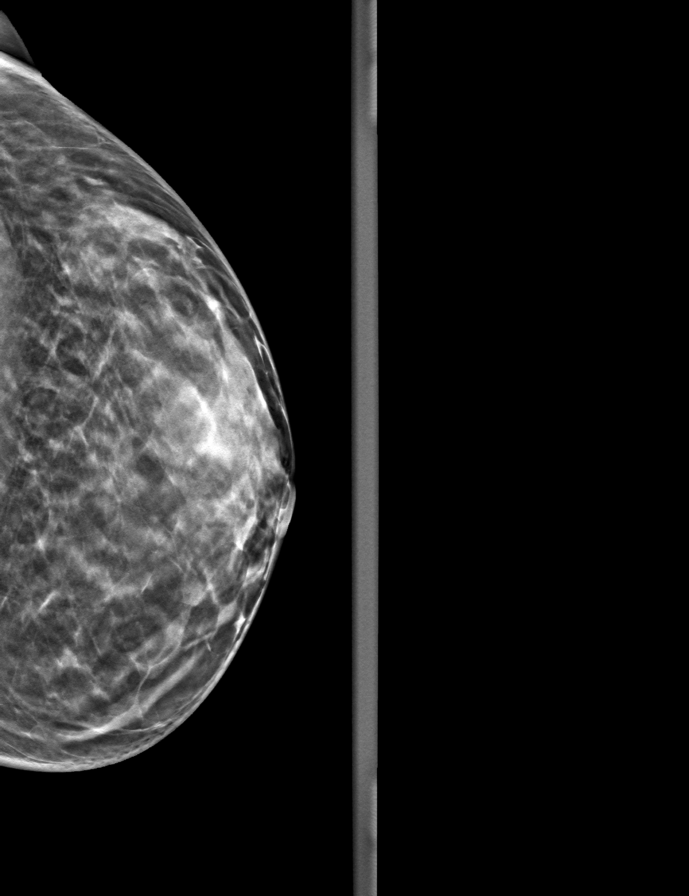
[frame 26/51]
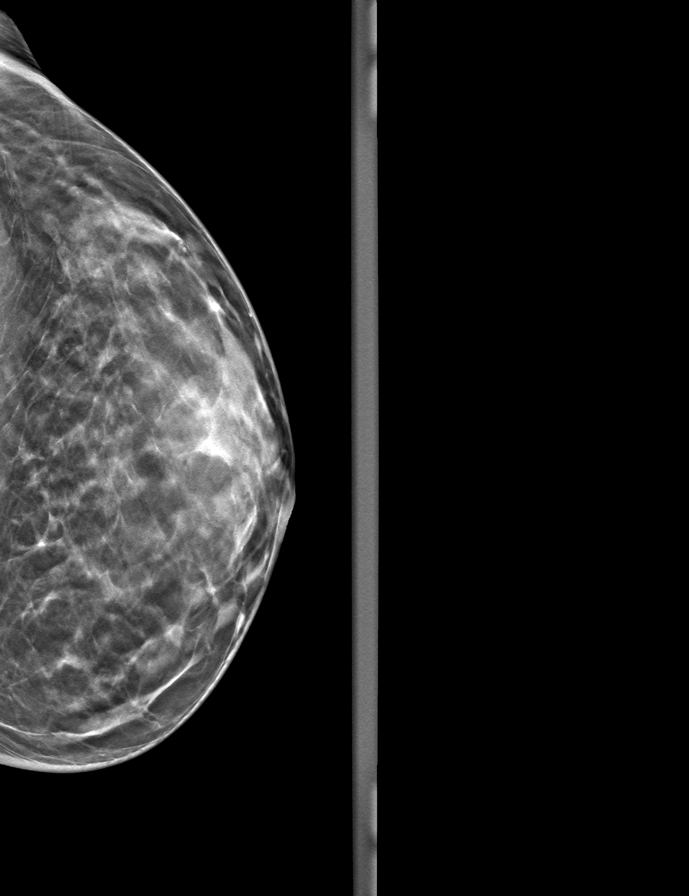

[L MLO tomo · tomo slice 28/55.0]
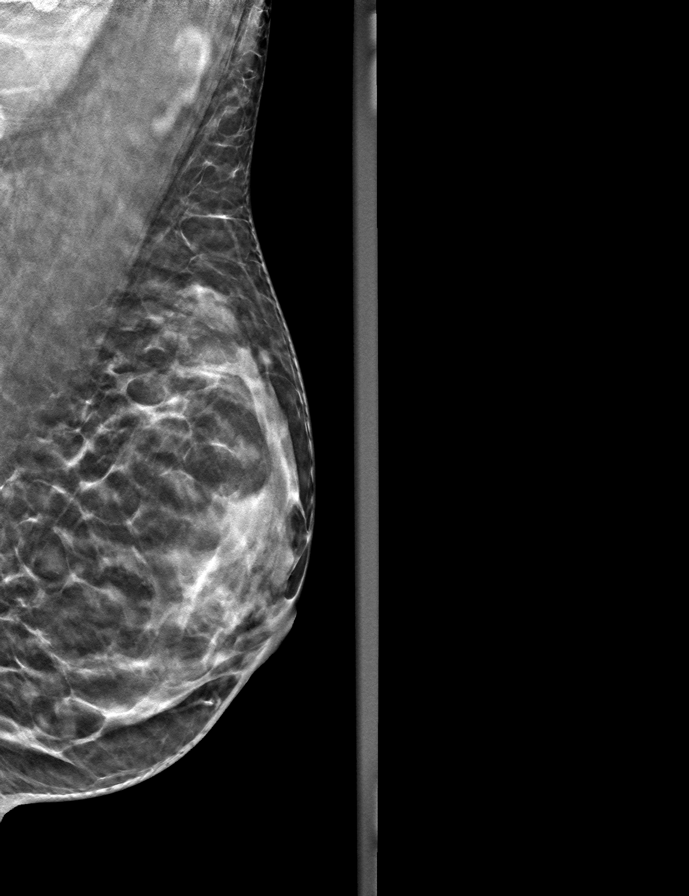

[R MLO tomo · tomo slice 27/53.0]
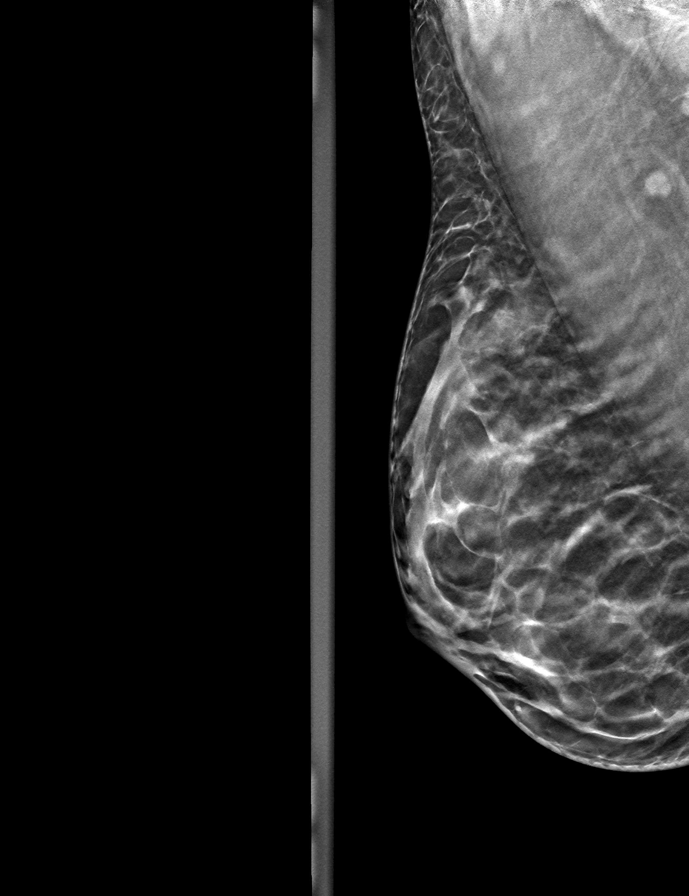

[R CC tomo · tomo slice 26/51.0]
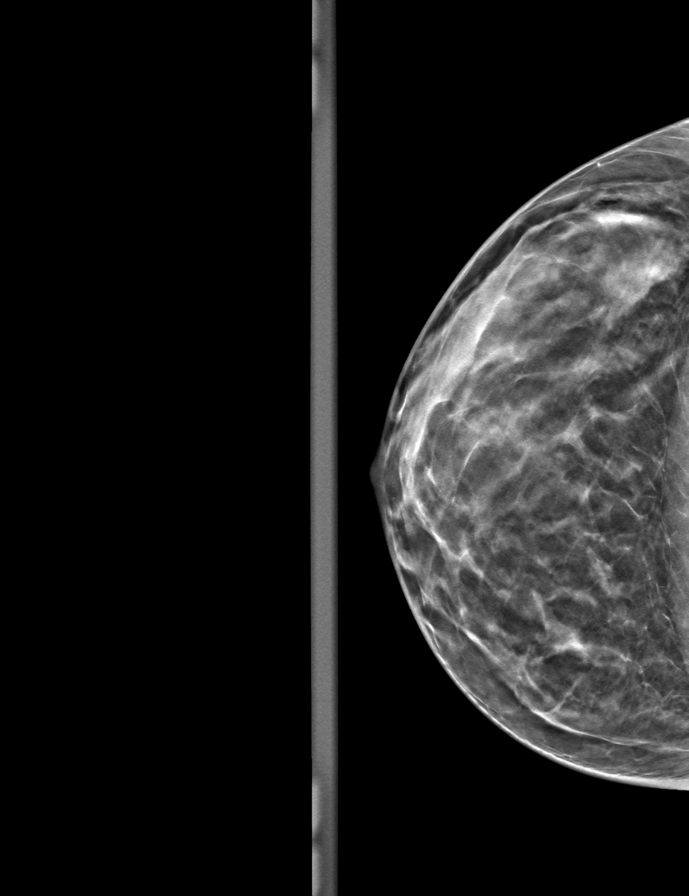

[9 of 24 positions shown; findings below may reference images not displayed]

ACR Breast Density Category c: The breast tissue is heterogeneously
dense, which may obscure small masses.
FINDINGS: There are no findings suspicious for malignancy.
IMPRESSION: No mammographic evidence of malignancy. A result letter of this
screening mammogram will be mailed directly to the patient.

RECOMMENDATION:
Screening mammogram in one year. (Code:Q3-W-BC3)

BI-RADS CATEGORY  1: Negative.

## 2022-12-26 ENCOUNTER — Other Ambulatory Visit: Payer: Self-pay

## 2022-12-26 LAB — POCT URINALYSIS DIPSTICK
Bilirubin, UA: NEGATIVE
Blood, UA: POSITIVE
Glucose, UA: NEGATIVE
Ketones, UA: NEGATIVE
Leukocytes, UA: NEGATIVE
Nitrite, UA: NEGATIVE
Protein, UA: NEGATIVE
Spec Grav, UA: 1.015 (ref 1.010–1.025)
Urobilinogen, UA: 0.2 E.U./dL
pH, UA: 6.5 (ref 5.0–8.0)

## 2022-12-26 MED ORDER — NITROFURANTOIN MONOHYD MACRO 100 MG PO CAPS: ORAL_CAPSULE | ORAL | 0 refills | Status: DC

## 2022-12-26 NOTE — Progress Notes (Signed)
Patient has been experiencing dysuria since 12/24/2022. Patient denies blood in urine and odor. Patient has taken Cystex which helped some but complaint has not entirely gone away.   Patient provided urine sample for UA and culture. Urine sample was bright red.  Patient has been notified of Rx sent (Macrobid 1 tab twice daily for 3 days)

## 2022-12-26 NOTE — Progress Notes (Signed)
Notify we are sending urine in for culture.   Also sent Rx for nitrofurantoin or macrobid twice daily for 3 days.

## 2022-12-29 LAB — URINE CULTURE

## 2023-01-28 ENCOUNTER — Encounter: Payer: Self-pay | Admitting: Internal Medicine

## 2023-03-07 ENCOUNTER — Other Ambulatory Visit: Payer: Self-pay

## 2023-03-07 MED ORDER — AMITRIPTYLINE HCL 25 MG PO TABS: 25.0000 mg | ORAL_TABLET | Freq: Every day | ORAL | 2 refills | Status: DC

## 2023-04-09 ENCOUNTER — Ambulatory Visit: Payer: Self-pay | Admitting: Internal Medicine

## 2023-04-09 ENCOUNTER — Encounter: Payer: Self-pay | Admitting: Internal Medicine

## 2023-04-09 ENCOUNTER — Other Ambulatory Visit: Payer: Self-pay | Admitting: Internal Medicine

## 2023-04-09 VITALS — BP 108/66 | HR 72 | Resp 12 | Ht 58.25 in | Wt 128.0 lb

## 2023-04-09 DIAGNOSIS — N898 Other specified noninflammatory disorders of vagina: Secondary | ICD-10-CM

## 2023-04-09 DIAGNOSIS — Z124 Encounter for screening for malignant neoplasm of cervix: Secondary | ICD-10-CM

## 2023-04-09 DIAGNOSIS — A749 Chlamydial infection, unspecified: Secondary | ICD-10-CM | POA: Insufficient documentation

## 2023-04-09 DIAGNOSIS — Z0000 Encounter for general adult medical examination without abnormal findings: Secondary | ICD-10-CM

## 2023-04-09 NOTE — Progress Notes (Signed)
Subjective:    Patient ID: Tabitha [name], female   DOB: June 22, 1981, 42 y.o.   MRN: [id number]   HPI  CPE with pap  1.  Pap:  Normal 11/2020 with [facility].  Always normal.    2.  Mammogram:  Last mammogram 12/2021.  Normal.  No family history of breast cancesr.    3.  Osteoprevention:  she does not eat or drink dairy or milk products regularly.  Not taking calcium/Vitamin D.  Willing to drink 3-4 cups of almond milk daily.  Walks 1 1/2 hours daily.    4.  Guaiac Cards/FIT:  Never.    5.  Colonoscopy:  Never.  No family history of colon cancer.   6.  Immunizations:  Does not appear to have obtained influenza vaccine this past fall.   Has had COVID vaccines--total of 3.  Only able to find her original covid vaccinations from 2022.    Immunization History  Administered Date(s) Administered   Influenza,inj,Quad PF,6+ Mos 10/02/2020   Tdap 09/09/2019     7.  Glucose/Cholesterol:  has never had cholesterol panel performed with [place].  Blood glucose okay in past  Current Meds  Medication Sig   amitriptyline (ELAVIL) 25 MG tablet Take 1 tablet (25 mg total) by mouth at bedtime.   ibuprofen (ADVIL) 800 MG tablet Take 1 tablet (800 mg total) by mouth every 6 (six) hours.   norethindrone-ethinyl estradiol (LOESTRIN 1/20, 21,) 1-20 MG-MCG tablet Take 1 tablet by mouth daily.   No Known Allergies  Past Medical History:  Diagnosis Date   Breast cyst 2011   [place], [place]   Chronic headaches 2016   Listed as tension and migranous at different times in old records.  Treated with Amitriptyline   Cystic acne 2016   Treated with Retin A 0.05% cream in past   Migraine    Recurrent UTI 2012 to present   Often difficult to resolve   Trigeminal neuralgia 2017   Right sided   Trigeminal neuralgia of right side of face 01/10/2017   Right V3 distribution   Past Surgical History:  Procedure Laterality Date   CESAREAN SECTION  09/30/2020   Procedure: CESAREAN SECTION;  Surgeon: Warden Fillers, MD;  Location: [facility];  Service: Obstetrics;;   NO PAST SURGERIES     Family History  Problem Relation Age of Onset   Hypertension Mother    Breast cancer Neg Hx    Family Status  Relation Name Status   Mother  Alive       18 yo   Father  Alive       61 yo   Sister  Armed forces training and education officer   Sister  Alive   Sister  Alive   Sister  Alive   Sister  Alive   Sister  Alive   Sister  Alive   Sister  Alive   Brother  Alive   Brother  Alive   Daughter [employer], age 2y   Neg Hx  (Not Specified)   Social History   Socioeconomic History   Marital status: Married    Spouse name: Franciso Bend   Number of children: 1   Years of education: Primary   Highest education level: 2nd grade  Occupational History   Occupation: Housecleaning part time.  Tobacco Use   Smoking status: Some Days    Packs/day: 0.25    Years: 7.00    Additional pack years: 0.00    Total pack years: 1.75  Types: Cigarettes   Smokeless tobacco: Never   Tobacco comments:    4 cigarettes per month  Vaping Use   Vaping Use: Never used  Substance and Sexual Activity   Alcohol use: Yes    Alcohol/week: 0.0 standard drinks of alcohol    Comment: socially   Drug use: No   Sexual activity: Yes    Partners: Male    Birth control/protection: Condom, None, Pill  Other Topics Concern   Not on file  Social History Narrative   Originally from [place]; 2nd grade education   [place]   Lived in [place] since 2.2017   She currently lives with another couple.   No longer lives with father of daughter.     Social Determinants of Health   Financial Resource Strain: Low Risk  (04/09/2023)   Overall Financial Resource Strain (CARDIA)    Difficulty of Paying Living Expenses: Not very hard  Food Insecurity: No Food Insecurity (04/09/2023)   Hunger Vital Sign    Worried About Running Out of Food in the Last Year: Never true    Ran Out of Food in the Last Year: Never true  Transportation Needs: No  Transportation Needs (01/24/2022)   PRAPARE - Administrator, Civil Service (Medical): No    Lack of Transportation (Non-Medical): No  Physical Activity: Not on file  Stress: Not on file  Social Connections: Not on file  Intimate Partner Violence: Not At Risk (04/09/2023)   Humiliation, Afraid, Rape, and Kick questionnaire    Fear of Current or Ex-Partner: No    Emotionally Abused: No    Physically Abused: No    Sexually Abused: No    Review of Systems  HENT:  Positive for dental problem (Two molars hurt with cold sensitivity.).   Genitourinary:        Some cycles, having dark blood flow with odor--better last cycle.      Objective:   BP 108/66 (BP Location: Left Arm, Patient Position: Sitting, Cuff Size: Normal)   Pulse 72   Resp 12   Ht 4' 10.25" (1.48 m)   Wt 128 lb (58.1 kg)   LMP 03/18/2023 (Exact Date)   BMI 26.52 kg/m   Physical Exam Constitutional:      Appearance: Normal appearance. She is normal weight.  HENT:     Head: Normocephalic and atraumatic.     Right Ear: Tympanic membrane, ear canal and external ear normal.     Left Ear: Tympanic membrane, ear canal and external ear normal.     Nose: Nose normal.     Mouth/Throat:     Mouth: Mucous membranes are moist.     Pharynx: Oropharynx is clear.  Eyes:     Extraocular Movements: Extraocular movements intact.     Pupils: Pupils are equal, round, and reactive to light.     Comments: Discs sharp  Neck:     Thyroid: No thyroid mass or thyromegaly.  Cardiovascular:     Rate and Rhythm: Normal rate and regular rhythm.     Heart sounds: S1 normal and S2 normal. No murmur heard.    No friction rub. No S3 or S4 sounds.     Comments: No carotid bruits.  Carotid, radial, femoral, DP and PT pulses normal and equal.   Pulmonary:     Effort: Pulmonary effort is normal.     Breath sounds: Normal breath sounds and air entry.  Chest:  Breasts:    Right: No  inverted nipple, mass, nipple discharge or skin  change.     Left: No inverted nipple, mass, nipple discharge or skin change.  Abdominal:     General: Abdomen is flat. Bowel sounds are normal.     Palpations: Abdomen is soft. There is no hepatomegaly, splenomegaly or mass.     Tenderness: There is no abdominal tenderness.     Hernia: No hernia is present.  Genitourinary:    Comments: Normal external female genitalia No cervical or vaginal inflammation or lesion Mild mucousy light yellow discharge No uterine or adnexal mass or tenderness. Musculoskeletal:        General: Normal range of motion.     Cervical back: Normal range of motion and neck supple.     Right lower leg: No edema.     Left lower leg: No edema.  Lymphadenopathy:     Head:     Right side of head: No submental or submandibular adenopathy.     Left side of head: No submental or submandibular adenopathy.     Cervical: No cervical adenopathy.     Upper Body:     Right upper body: No supraclavicular or axillary adenopathy.     Left upper body: No supraclavicular or axillary adenopathy.     Lower Body: No right inguinal adenopathy. No left inguinal adenopathy.  Skin:    General: Skin is warm.     Capillary Refill: Capillary refill takes less than 2 seconds.     Findings: No rash.  Neurological:     General: No focal deficit present.     Mental Status: She is alert and oriented to person, place, and time.     Cranial Nerves: Cranial nerves 2-12 are intact.     Sensory: Sensation is intact.     Motor: Motor function is intact.     Coordination: Coordination is intact.     Gait: Gait is intact.     Deep Tendon Reflexes: Reflexes are normal and symmetric.  Psychiatric:        Speech: Speech normal.        Behavior: Behavior normal. Behavior is cooperative.      Assessment & Plan   CPE with pap Mammogram 12/2023 Encouraged yearly influenza vaccination in fall Encouraged COVID vaccination. Return for fasting labs with FIT in 2 weeks:  FLP, CBC, CMP  2.   Vaginal discharge:  GC/chlamydia.    3.  Dental sensitivity:  encouraged orange card application to send to dental clinic.

## 2023-04-14 LAB — GC/CHLAMYDIA PROBE AMP
Chlamydia trachomatis, NAA: POSITIVE — AB
Neisseria Gonorrhoeae by PCR: NEGATIVE

## 2023-04-14 LAB — CYTOLOGY - PAP

## 2023-04-24 ENCOUNTER — Other Ambulatory Visit: Payer: Self-pay

## 2023-04-24 DIAGNOSIS — Z79899 Other long term (current) drug therapy: Secondary | ICD-10-CM

## 2023-04-24 DIAGNOSIS — Z13220 Encounter for screening for lipoid disorders: Secondary | ICD-10-CM

## 2023-04-24 DIAGNOSIS — Z1211 Encounter for screening for malignant neoplasm of colon: Secondary | ICD-10-CM

## 2023-04-24 LAB — POC FIT TEST STOOL: Fecal Occult Blood: NEGATIVE

## 2023-04-24 MED ORDER — NORETHINDRONE ACET-ETHINYL EST 1-20 MG-MCG PO TABS: 1.0000 | ORAL_TABLET | Freq: Every day | ORAL | 11 refills | Status: AC

## 2023-04-25 ENCOUNTER — Other Ambulatory Visit: Payer: Self-pay | Admitting: Internal Medicine

## 2023-04-25 LAB — CBC WITH DIFFERENTIAL/PLATELET
Basophils Absolute: 0 10*3/uL (ref 0.0–0.2)
Basos: 1 %
EOS (ABSOLUTE): 0 10*3/uL (ref 0.0–0.4)
Eos: 1 %
Hematocrit: 43.2 % (ref 34.0–46.6)
Hemoglobin: 13.7 g/dL (ref 11.1–15.9)
Immature Grans (Abs): 0 10*3/uL (ref 0.0–0.1)
Immature Granulocytes: 0 %
Lymphocytes Absolute: 1.5 10*3/uL (ref 0.7–3.1)
Lymphs: 37 %
MCH: 30 pg (ref 26.6–33.0)
MCHC: 31.7 g/dL (ref 31.5–35.7)
MCV: 95 fL (ref 79–97)
Monocytes Absolute: 0.2 10*3/uL (ref 0.1–0.9)
Monocytes: 6 %
Neutrophils Absolute: 2.3 10*3/uL (ref 1.4–7.0)
Neutrophils: 55 %
Platelets: 246 10*3/uL (ref 150–450)
RBC: 4.57 x10E6/uL (ref 3.77–5.28)
RDW: 12.4 % (ref 11.7–15.4)
WBC: 4.1 10*3/uL (ref 3.4–10.8)

## 2023-04-25 LAB — COMPREHENSIVE METABOLIC PANEL
ALT: 21 IU/L (ref 0–32)
AST: 22 IU/L (ref 0–40)
Albumin/Globulin Ratio: 1.5 (ref 1.2–2.2)
Albumin: 4.2 g/dL (ref 3.9–4.9)
Alkaline Phosphatase: 51 IU/L (ref 44–121)
BUN/Creatinine Ratio: 16 (ref 9–23)
BUN: 13 mg/dL (ref 6–24)
Bilirubin Total: 0.3 mg/dL (ref 0.0–1.2)
CO2: 23 mmol/L (ref 20–29)
Calcium: 9 mg/dL (ref 8.7–10.2)
Chloride: 105 mmol/L (ref 96–106)
Creatinine, Ser: 0.79 mg/dL (ref 0.57–1.00)
Globulin, Total: 2.8 g/dL (ref 1.5–4.5)
Glucose: 91 mg/dL (ref 70–99)
Potassium: 4.2 mmol/L (ref 3.5–5.2)
Sodium: 141 mmol/L (ref 134–144)
Total Protein: 7 g/dL (ref 6.0–8.5)
eGFR: 96 mL/min/{1.73_m2} (ref >59)

## 2023-04-25 LAB — LIPID PANEL W/O CHOL/HDL RATIO
Cholesterol, Total: 153 mg/dL (ref 100–199)
HDL: 37 mg/dL — ABNORMAL LOW (ref >39)
LDL Chol Calc (NIH): 92 mg/dL (ref 0–99)
Triglycerides: 132 mg/dL (ref 0–149)
VLDL Cholesterol Cal: 24 mg/dL (ref 5–40)

## 2023-04-25 NOTE — Addendum Note (Signed)
Addended by: Duayne Cal on: 04/25/2023 04:16 PM   Modules accepted: Orders

## 2023-05-20 ENCOUNTER — Other Ambulatory Visit: Payer: Self-pay

## 2023-05-20 MED ORDER — AMITRIPTYLINE HCL 25 MG PO TABS: 25.0000 mg | ORAL_TABLET | Freq: Every day | ORAL | 11 refills | Status: AC

## 2023-06-06 ENCOUNTER — Other Ambulatory Visit: Payer: Self-pay | Admitting: Internal Medicine

## 2023-06-06 MED ORDER — DOXYCYCLINE HYCLATE 100 MG PO TABS: ORAL_TABLET | ORAL | 0 refills | Status: DC

## 2023-06-09 NOTE — Progress Notes (Signed)
Called patient per Dr. Renne Crigler request, notify her that she did not have to tell her Ex husband Cephus Shelling) if she wasn't comfortable telling him.  Patient stated she had already told ex husband and that he mentioned to her that he has been seen here at the clinic in April and has also been tested because he wasn't feeling well and that he was waiting on the results.

## 2023-06-13 ENCOUNTER — Telehealth: Payer: Self-pay

## 2023-06-13 MED ORDER — FLUCONAZOLE 150 MG PO TABS: 150.0000 mg | ORAL_TABLET | Freq: Once | ORAL | 0 refills | Status: AC

## 2023-06-13 NOTE — Telephone Encounter (Signed)
Patient called to report that she completed doxycycline. Since taking Rx her symptoms have gotten worse. She has increased itching and pain. She continues to have yellow discharge. Would like recommendation.

## 2023-06-13 NOTE — Telephone Encounter (Signed)
Yeast infection suspected. Fluconazole 150mg  tablet was sent to the pharmacy. Patient instructed to call us if her symptoms continue into next week.

## 2023-07-10 ENCOUNTER — Other Ambulatory Visit: Payer: Self-pay

## 2023-07-10 NOTE — Progress Notes (Signed)
Itching on the out side, burning, yellowish/white, and odor discharge since Monday 07/07/2023. Patient denies dysuria. Patient is currently taking motrin, vagisil and azo, but it is not helping. Patient is currently on her period. Patient preferred pharmacy is [facility] in [place].

## 2023-07-22 ENCOUNTER — Other Ambulatory Visit: Payer: Self-pay

## 2023-07-22 DIAGNOSIS — R300 Dysuria: Secondary | ICD-10-CM

## 2023-07-22 DIAGNOSIS — B9689 Other specified bacterial agents as the cause of diseases classified elsewhere: Secondary | ICD-10-CM

## 2023-07-22 DIAGNOSIS — R319 Hematuria, unspecified: Secondary | ICD-10-CM

## 2023-07-22 DIAGNOSIS — N898 Other specified noninflammatory disorders of vagina: Secondary | ICD-10-CM

## 2023-07-22 LAB — POCT URINALYSIS DIPSTICK
Bilirubin, UA: NEGATIVE
Glucose, UA: NEGATIVE
Ketones, UA: NEGATIVE
Nitrite, UA: NEGATIVE
Protein, UA: NEGATIVE
Spec Grav, UA: 1.015 (ref 1.010–1.025)
Urobilinogen, UA: 0.2 E.U./dL
pH, UA: 7 (ref 5.0–8.0)

## 2023-07-22 LAB — POCT WET PREP WITH KOH
KOH Prep POC: NEGATIVE
RBC Wet Prep HPF POC: NEGATIVE
Trichomonas, UA: NEGATIVE
Yeast Wet Prep HPF POC: NEGATIVE

## 2023-07-22 MED ORDER — METRONIDAZOLE 500 MG PO TABS: 500.0000 mg | ORAL_TABLET | Freq: Two times a day (BID) | ORAL | 0 refills | Status: AC

## 2023-07-22 NOTE — Progress Notes (Signed)
Discharge, itching, dysuria for about 2 week. Patient denies odor. Patient has taken motrin and azo which has not helped any.

## 2023-07-22 NOTE — Progress Notes (Signed)
Have her come back for self swab for GC/chlamydia before starting Metronidazole for BV.

## 2023-07-22 NOTE — Addendum Note (Signed)
Addended by: Duayne Cal on: 07/22/2023 11:50 AM   Modules accepted: Orders

## 2023-07-22 NOTE — Addendum Note (Signed)
Addended by: Duayne Cal on: 07/22/2023 11:54 AM   Modules accepted: Orders

## 2023-07-23 LAB — GC/CHLAMYDIA PROBE AMP

## 2023-07-28 ENCOUNTER — Ambulatory Visit: Payer: Self-pay | Admitting: Internal Medicine

## 2023-07-28 ENCOUNTER — Encounter: Payer: Self-pay | Admitting: Internal Medicine

## 2023-07-28 VITALS — BP 104/62 | HR 67 | Resp 16 | Ht 58.25 in | Wt 125.0 lb

## 2023-07-28 DIAGNOSIS — N898 Other specified noninflammatory disorders of vagina: Secondary | ICD-10-CM

## 2023-07-28 DIAGNOSIS — N761 Subacute and chronic vaginitis: Secondary | ICD-10-CM

## 2023-07-28 LAB — POCT WET PREP WITH KOH
KOH Prep POC: NEGATIVE
RBC Wet Prep HPF POC: NEGATIVE
Trichomonas, UA: NEGATIVE

## 2023-07-28 MED ORDER — FLUCONAZOLE 150 MG PO TABS: ORAL_TABLET | ORAL | 0 refills | Status: DC

## 2023-07-28 NOTE — Progress Notes (Signed)
    Subjective:    Patient ID: Tabitha [name], female   DOB: 1981/08/08, 42 y.o.   MRN: [id number]   HPI  Duayne Cal interprets   Vaginal itching and discharge.  She was treated for chlamydia at the beginning of June, though was from tests with her CPE in April.   She states she had burning and pain still.  She returned on 7.23 and performed self swab for wet prep as she was having a dark, almost black discharge.  Was found to have Bacterial Vaginosis and started on Metronidazole.   She states she improved with Metronidazole, but now with yellow vaginal discharge and external burning as well as itching.  Feels like she has small cuts on external vaginal issues.  She is using wet wipes.  Describes a lot of white discharge in the morning.   No odor. She has not been sexually active since treated for chlamydia.   She states her two female contacts were both negative for chlamydia.   She does state she has used Vagisil and Monistat both externally and internally.  Last use was 3 days ago.  Current Meds  Medication Sig   amitriptyline (ELAVIL) 25 MG tablet Take 1 tablet (25 mg total) by mouth at bedtime.   ibuprofen (ADVIL) 800 MG tablet Take 1 tablet (800 mg total) by mouth every 6 (six) hours.   norethindrone-ethinyl estradiol (LOESTRIN 1/20, 21,) 1-20 MG-MCG tablet Take 1 tablet by mouth daily.   No Known Allergies   Review of Systems    Objective:   BP 104/62 (BP Location: Left Arm, Patient Position: Sitting, Cuff Size: Normal)   Pulse 67   Resp 16   Ht 4' 10.25" (1.48 m)   Wt 125 lb (56.7 kg)   BMI 25.90 kg/m   Physical Exam NAD GU:  vulva with external inflammation.  Thick yellow discharge without odor and underlying vaginal wall and cervical inflammation/erythema.  No CMT.    Wet prep:  extensive white cells and squamous cells with some yeast.   Assessment & Plan  Vaginitis:  Does have some yeast and may explain all of her symptoms with recent rounds of  Doxycycline and Metronidazole.   Suspect some of discharge is from recent use of intravaginal topicals. Fluconazole 150 mg daily for 3 days. To call if no improvement GC/chlamydia repeat.

## 2023-08-08 ENCOUNTER — Telehealth: Payer: Self-pay

## 2023-08-08 ENCOUNTER — Other Ambulatory Visit: Payer: Self-pay

## 2023-08-08 MED ORDER — FLUCONAZOLE 150 MG PO TABS: ORAL_TABLET | ORAL | 0 refills | Status: DC

## 2023-08-08 NOTE — Telephone Encounter (Signed)
Patient called to report that she continues to have vaginal itching and burning. Fluconazole did help reduce intensity, but has not cleared it. Patient wants to know what to do next.

## 2023-08-11 NOTE — Telephone Encounter (Signed)
Patient was sent a refill for fluconazole on 08/08/2023 and was instructed to call if sx did not completely resolve.

## 2023-08-19 ENCOUNTER — Other Ambulatory Visit: Payer: Self-pay

## 2023-08-19 ENCOUNTER — Ambulatory Visit: Payer: Self-pay | Admitting: Internal Medicine

## 2023-08-19 DIAGNOSIS — R350 Frequency of micturition: Secondary | ICD-10-CM

## 2023-08-19 LAB — POCT URINALYSIS DIPSTICK
Bilirubin, UA: NEGATIVE
Glucose, UA: NEGATIVE
Ketones, UA: NEGATIVE
Leukocytes, UA: NEGATIVE
Nitrite, UA: NEGATIVE
Protein, UA: NEGATIVE
Spec Grav, UA: 1.02 (ref 1.010–1.025)
Urobilinogen, UA: 0.2 E.U./dL
pH, UA: 7.5 (ref 5.0–8.0)

## 2023-08-19 NOTE — Progress Notes (Signed)
Patient reports that she has lower abdomen pain, lower back pain, burning after urinating, yellow discharge and urinary frequency. Patient has continued to have these symptoms for about 2 months. Patient has a variety vaginal cream, but nothing seems to help. Patient reports that Fluconazole only help with itching, but all other symptoms continue.   Patient instructed to stop using OTC creams and medications for a week. Patient has been scheduled to return in 1 week. Patient instructed to take cold water baths with aveeno oatmeal if discomfort get worse.

## 2023-08-28 ENCOUNTER — Encounter: Payer: Self-pay | Admitting: Internal Medicine

## 2023-08-28 ENCOUNTER — Ambulatory Visit: Payer: Self-pay | Admitting: Internal Medicine

## 2023-08-28 VITALS — BP 110/68 | HR 76 | Resp 16 | Ht 58.25 in | Wt 126.5 lb

## 2023-08-28 DIAGNOSIS — R300 Dysuria: Secondary | ICD-10-CM

## 2023-08-28 DIAGNOSIS — N9489 Other specified conditions associated with female genital organs and menstrual cycle: Secondary | ICD-10-CM

## 2023-08-28 LAB — POCT WET PREP WITH KOH
Clue Cells Wet Prep HPF POC: NEGATIVE
KOH Prep POC: NEGATIVE
RBC Wet Prep HPF POC: NEGATIVE
Trichomonas, UA: NEGATIVE
Yeast Wet Prep HPF POC: NEGATIVE

## 2023-08-28 LAB — POCT URINALYSIS DIPSTICK
Bilirubin, UA: NEGATIVE
Glucose, UA: NEGATIVE
Ketones, UA: NEGATIVE
Leukocytes, UA: NEGATIVE
Nitrite, UA: NEGATIVE
Protein, UA: NEGATIVE
Spec Grav, UA: 1.02 (ref 1.010–1.025)
Urobilinogen, UA: 0.2 E.U./dL
pH, UA: 7 (ref 5.0–8.0)

## 2023-08-28 NOTE — Patient Instructions (Signed)
Astro glide Vagisil silk Coconut oil

## 2023-08-28 NOTE — Progress Notes (Signed)
    Subjective:    Patient ID: Tabitha [name], female   DOB: 1981/05/15, 42 y.o.   MRN: [id number]   HPI  See previous visits that started with diagnosis of chlamydia and repeat visits for dysuria, vaginal discharge and itching. Her chlamydia cleared with last check earlier this month and she was noted to have BV with one of visits earlier.   The last visit with budding yeast on wet prep.  Was treated with 2 rounds of fluconazole and after the last round, she no longer has itching, but has burning she feels from "inside" and not the "outside".  Clarifies this is with urination, though shared with medical assistant she did not have dysuria. She states now she does not feel this is from her vaginal canal, but from her urinary system. Has been using a cream on her clitoral area:  has tried Vagisil among topical creams she cannot name.  She does not believe any of them contained cortisone, but not clear.    Addendum:  She last had intercourse 5 days ago with female partner, who she states showed her lab documentation of being tested and found negative for chlamydia and gonorrhea.  He was not treated. They use condoms and she continues with BCPs, which she has used for more than 1 year without side effects.  They were unable to complete intercourse as was too uncomfortable for her, even with lubricated condom and KY jelly. She does use pantiliners, though not Always brand.   She changes her underwear 3 times daily as she states she gets itchy if she does not.   She was using an unknown wash marketed specifically to wash the female genitalia area, but stopped that 7 days ago and only using water since.   Current Meds  Medication Sig   amitriptyline (ELAVIL) 25 MG tablet Take 1 tablet (25 mg total) by mouth at bedtime.   ibuprofen (ADVIL) 800 MG tablet Take 1 tablet (800 mg total) by mouth every 6 (six) hours.   norethindrone-ethinyl estradiol (LOESTRIN 1/20, 21,) 1-20 MG-MCG tablet Take 1 tablet  by mouth daily.   No Known Allergies   Review of Systems    Objective:   BP 110/68 (BP Location: Left Arm, Patient Position: Sitting, Cuff Size: Normal)   Pulse 76   Resp 16   Ht 4' 10.25" (1.48 m)   Wt 126 lb 8 oz (57.4 kg)   LMP 08/06/2023 (Exact Date)   BMI 26.21 kg/m   Physical Exam NAD GU:  Normal vulvar mucosa.  No swelling, erythema or mass about her clitoris or external genitalia.  States tender with gentle palpation  Vaginal canal is dry and without lubrication or discharge, and without erythema or lesion. + discomfort with palpation of uterus and adnexa, but not mass   No CMT.     Assessment & Plan  Continued complaints of dysuria/vulvar burning:  not clear what is causing her symptoms.  Based on exam and history, concerned she is overcleaning and utilizing multiple topicals that maintain dryness and burning.   She did not have these symptoms for a year on her BCPs. Wet prep was unremarkable Sending GC/chlamydia again. Stop all topicals save for options for lubrication with intercourse. Stop lubricated condoms, pantiliners, soaps or other washes. Wear cotton underwear. Will call with UA results--did have moderate RBCs. Send urine for culture. If negative, consider urology referral Consider GYN referral if symptoms continue.

## 2023-08-29 ENCOUNTER — Telehealth: Payer: Self-pay

## 2023-08-29 DIAGNOSIS — K0889 Other specified disorders of teeth and supporting structures: Secondary | ICD-10-CM

## 2023-08-29 NOTE — Telephone Encounter (Signed)
Patient called to report that a upper right molar fill in has fell out. Has pain on that molar would like a referral to get it filled in. Has an active orange card.

## 2023-09-01 LAB — URINE CULTURE

## 2023-09-01 MED ORDER — NITROFURANTOIN MONOHYD MACRO 100 MG PO CAPS: ORAL_CAPSULE | ORAL | 0 refills | Status: DC

## 2023-09-01 NOTE — Addendum Note (Signed)
Addended by: Marcene Duos on: 09/01/2023 10:38 PM   Modules accepted: Orders

## 2023-09-02 LAB — GC/CHLAMYDIA PROBE AMP
Chlamydia trachomatis, NAA: NEGATIVE
Neisseria Gonorrhoeae by PCR: NEGATIVE

## 2023-09-09 ENCOUNTER — Ambulatory Visit: Payer: Self-pay | Admitting: Internal Medicine

## 2023-09-09 ENCOUNTER — Telehealth: Payer: Self-pay

## 2023-09-09 NOTE — Telephone Encounter (Signed)
Patient called to report that symptoms have continued after completing Macrobid. Patient continues to have dysuria, discharge (greenish), itchy, burning, and pain. Reports that medication did not make any difference. Patient would like to be sent to GYN.

## 2023-09-11 ENCOUNTER — Other Ambulatory Visit: Payer: Self-pay

## 2023-09-11 DIAGNOSIS — N898 Other specified noninflammatory disorders of vagina: Secondary | ICD-10-CM

## 2023-09-11 DIAGNOSIS — R300 Dysuria: Secondary | ICD-10-CM

## 2023-09-12 ENCOUNTER — Other Ambulatory Visit: Payer: Self-pay

## 2023-09-13 LAB — URINE CULTURE: Organism ID, Bacteria: NO GROWTH

## 2023-09-15 ENCOUNTER — Telehealth: Payer: Self-pay

## 2023-09-15 NOTE — Telephone Encounter (Signed)
Patient has been notified of referral.

## 2023-09-15 NOTE — Addendum Note (Signed)
Addended by: Marcene Duos on: 09/15/2023 12:31 PM   Modules accepted: Orders

## 2023-09-15 NOTE — Telephone Encounter (Signed)
Patient called to get an appointment with GYN and was told that appointment would be in November. Patient states that she cannot wait until then. Would like to see if there are any other options.  I checked with [facility] and [facility], found that they also have appointment in November.

## 2023-10-06 NOTE — Telephone Encounter (Signed)
We discussed this some time ago, but do not see the documentation:  I would be unable to get her in any sooner.  She could go to an urgent care, but I do not believe she would not be able to get specialist care there.

## 2023-10-15 NOTE — Telephone Encounter (Signed)
Patient decided to wait for specialist appointment.

## 2023-10-16 NOTE — Telephone Encounter (Signed)
Dental referral is ready to be sent to [facility].

## 2023-11-25 ENCOUNTER — Encounter: Payer: Self-pay | Admitting: Obstetrics and Gynecology

## 2023-11-25 ENCOUNTER — Other Ambulatory Visit: Payer: Self-pay

## 2023-11-25 ENCOUNTER — Other Ambulatory Visit (HOSPITAL_COMMUNITY)
Admission: RE | Admit: 2023-11-25 | Discharge: 2023-11-25 | Disposition: A | Discharge status: Home / Self Care | Payer: Self-pay | Source: Ambulatory Visit | Attending: Obstetrics and Gynecology | Admitting: Obstetrics and Gynecology

## 2023-11-25 ENCOUNTER — Ambulatory Visit (INDEPENDENT_AMBULATORY_CARE_PROVIDER_SITE_OTHER): Payer: Self-pay | Admitting: Obstetrics and Gynecology

## 2023-11-25 VITALS — BP 119/71 | HR 80 | Wt 126.0 lb

## 2023-11-25 DIAGNOSIS — N9489 Other specified conditions associated with female genital organs and menstrual cycle: Secondary | ICD-10-CM | POA: Insufficient documentation

## 2023-11-25 DIAGNOSIS — Z3202 Encounter for pregnancy test, result negative: Secondary | ICD-10-CM

## 2023-11-25 LAB — POCT URINALYSIS DIP (DEVICE)
Bilirubin Urine: NEGATIVE
Glucose, UA: NEGATIVE mg/dL
Ketones, ur: NEGATIVE mg/dL
Nitrite: NEGATIVE
Protein, ur: NEGATIVE mg/dL
Specific Gravity, Urine: 1.02 (ref 1.005–1.030)
Urobilinogen, UA: 0.2 mg/dL (ref 0.0–1.0)
pH: 7 (ref 5.0–8.0)

## 2023-11-25 LAB — POCT PREGNANCY, URINE: Preg Test, Ur: NEGATIVE

## 2023-11-25 MED ORDER — ESTRADIOL 0.1 MG/GM VA CREA: TOPICAL_CREAM | VAGINAL | 12 refills | Status: DC

## 2023-11-25 MED ORDER — ESTRADIOL 0.1 MG/GM VA CREA: TOPICAL_CREAM | VAGINAL | 12 refills | Status: AC

## 2023-11-25 NOTE — Progress Notes (Signed)
NEW GYNECOLOGY PATIENT Patient name: Tabitha [name] MRN [id number]  Date of birth: 08-Jan-1981 Chief Complaint:   Gynecologic Exam     History:  Tabitha [name] is a 42 y.o. G2P1011 being seen today for vaginal burning.   She has been experiencing this for about 5 months. When peeing she feels a burning sensation inside the bladder. The vaginal opening feels irritated. She is not able to engage in sexual intercourse due to burning with penetration and pain. She did not previously have pain with intercourse. No abnormal vaginal discharge with the burning and pain. She have never experienced anything like this before. She has tried using lubricant but that has not helped. No bleeding associated with these symptoms. She previously had been having regular menses - no menses for the last 2 months. She is not experiencing hot flashes. She has had 2 negative pregnancy tests at home, last one was Sunday.  She denies vaginal dryness- typically has a sensation of wetness. She notes having a little itching and burning in the vagina.  Currently taking elavil for sleep.      Gynecologic History Patient's last menstrual period was 09/11/2023 (approximate). Contraception: Nexplanon Last Pap:     Component Value Date/Time   DIAGPAP  12/12/2020 0845    - Negative for intraepithelial lesion or malignancy (NILM)   HPVHIGH Negative 12/12/2020 0845   ADEQPAP  12/12/2020 0845    Satisfactory for evaluation; transformation zone component PRESENT.    High Risk HPV: Positive  Adequacy:  Satisfactory for evaluation, transformation zone component PRESENT  Diagnosis:  Atypical squamous cells of undetermined significance (ASC-US)  Last Mammogram:  12/2021 BIRADS 1 Last Colonoscopy: n/a  Obstetric History OB History  Gravida Para Term Preterm AB Living  2 1 1   1 1   SAB IAB Ectopic Multiple Live Births  1     0 1    # Outcome Date GA Lbr Len/2nd Weight Sex Type Anes PTL Lv  2 Term 09/30/20 [redacted]w[redacted]d   7 lb 10.2 oz (3.464 kg) F CS-LTranv EPI  LIV  1 SAB             Past Medical History:  Diagnosis Date   Breast cyst 2011   [place], [place]   Chronic headaches 2016   Listed as tension and migranous at different times in old records.  Treated with Amitriptyline   Cystic acne 2016   Treated with Retin A 0.05% cream in past   Migraine    Recurrent UTI 2012 to present   Often difficult to resolve   Trigeminal neuralgia 2017   Right sided   Trigeminal neuralgia of right side of face 01/10/2017   Right V3 distribution    Past Surgical History:  Procedure Laterality Date   CESAREAN SECTION  09/30/2020   Procedure: CESAREAN SECTION;  Surgeon: Warden Fillers, MD;  Location: [facility] LD [facility];  Service: Obstetrics;;   NO PAST SURGERIES      Current Outpatient Medications on File Prior to Visit  Medication Sig Dispense Refill   amitriptyline (ELAVIL) 25 MG tablet Take 1 tablet (25 mg total) by mouth at bedtime. 30 tablet 11   ibuprofen (ADVIL) 800 MG tablet Take 1 tablet (800 mg total) by mouth every 6 (six) hours. 30 tablet 2   norethindrone-ethinyl estradiol (LOESTRIN 1/20, 21,) 1-20 MG-MCG tablet Take 1 tablet by mouth daily. 28 tablet 11   No current facility-administered medications on file prior to visit.    No Known Allergies  Social History:  reports that she has been smoking cigarettes. She has a 1.8 pack-year smoking history. She has never used smokeless tobacco. She reports current alcohol use. She reports that she does not use drugs.  Family History  Problem Relation Age of Onset   Hypertension Mother    Breast cancer Neg Hx     The following portions of the patient's history were reviewed and updated as appropriate: allergies, current medications, past family history, past medical history, past social history, past surgical history and problem list.  Review of Systems Pertinent items noted in HPI and remainder of comprehensive ROS otherwise negative.  Physical Exam:  BP  119/71   Pulse 80   Wt 126 lb (57.2 kg)   LMP 09/11/2023 (Approximate)   BMI 26.11 kg/m  Physical Exam Vitals and nursing note reviewed. Exam conducted with a chaperone present.  Constitutional:      Appearance: Normal appearance.  Pulmonary:     Effort: Pulmonary effort is normal.  Abdominal:     Palpations: Abdomen is soft.     Comments: + [place]  Genitourinary:    General: Normal vulva.     Exam position: Lithotomy position.     Comments: Mild pallor of vaginal mucosa at introitus Normal appearing vulva No urethral caruncle Allodynia throughout introitus Neurological:     Mental Status: She is alert.      Assessment and Plan:   1. Vaginal burning Suspect possible GSM vs vaginitis. Vaginitis swab collected as well as urine culture. Noted to have had prior positive Ucx with subsequent negative culture in August and September. Will presumptively treat allodynia with vaginal estrogen and follow up response in 2-3 months.  - Cervicovaginal ancillary only - POCT urinalysis dip (device) - Urine Culture - Pregnancy, urine POC - estradiol (ESTRACE) 0.1 MG/GM vaginal cream; Apply 1 gram per vagina every night for 2 weeks, then apply three times a week  Dispense: 30 g; Refill: 12    Routine preventative health maintenance measures emphasized. Please refer to After Visit Summary for other counseling recommendations.   Follow-up: No follow-ups on file.      Lorriane Shire, MD Obstetrician & Gynecologist, Faculty Practice [facility] [employer], [facility]

## 2023-11-26 LAB — CERVICOVAGINAL ANCILLARY ONLY
Bacterial Vaginitis (gardnerella): POSITIVE — AB
Chlamydia: NEGATIVE
Comment: NEGATIVE
Comment: NEGATIVE
Comment: NORMAL
Neisseria Gonorrhea: NEGATIVE

## 2023-11-26 LAB — URINE CULTURE

## 2023-11-28 ENCOUNTER — Other Ambulatory Visit: Payer: Self-pay | Admitting: Obstetrics and Gynecology

## 2023-11-28 DIAGNOSIS — B9689 Other specified bacterial agents as the cause of diseases classified elsewhere: Secondary | ICD-10-CM

## 2023-11-28 MED ORDER — METRONIDAZOLE 500 MG PO TABS: 500.0000 mg | ORAL_TABLET | Freq: Two times a day (BID) | ORAL | 0 refills | Status: AC

## 2023-12-01 ENCOUNTER — Telehealth: Payer: Self-pay

## 2023-12-01 NOTE — Telephone Encounter (Addendum)
-----   Message from Lorriane Shire sent at 11/28/2023  6:20 PM EST ----- Results show no urine infection but BV. Recommend treatment and continue to use vaginal estrogen as well.   Called pt with CAP interpreter Gershon Cull; results and recommendation given. Pt will call back if symptoms persist.

## 2024-02-04 ENCOUNTER — Ambulatory Visit: Payer: Self-pay | Admitting: Obstetrics and Gynecology

## 2024-04-12 ENCOUNTER — Encounter: Payer: Self-pay | Admitting: Internal Medicine

## 2024-05-25 ENCOUNTER — Other Ambulatory Visit: Payer: Self-pay

## 2024-05-25 ENCOUNTER — Telehealth: Payer: Self-pay

## 2024-05-25 DIAGNOSIS — N644 Mastodynia: Secondary | ICD-10-CM

## 2024-05-25 DIAGNOSIS — M79622 Pain in left upper arm: Secondary | ICD-10-CM

## 2024-05-25 NOTE — Telephone Encounter (Signed)
 Via Julie Sowell, [facility] Spanish Interpreter, Patient called and stated she has pain in entire left breast, Left axillary x 2 months, comes and goes with some notable swelling the left axillary. Patient denies any lumps, breast discharge, caffeine intake.Patient stated she did noticed some tenderness with menses and when it is not close to start of menses, continues to take oral contraceptives. Patient informed needs to be evaluated in [facility] clinic, verbalized understanding.

## 2024-07-13 ENCOUNTER — Ambulatory Visit
Admission: RE | Admit: 2024-07-13 | Discharge: 2024-07-13 | Disposition: A | Discharge status: Home / Self Care | Source: Ambulatory Visit | Attending: Obstetrics and Gynecology | Admitting: Obstetrics and Gynecology

## 2024-07-13 ENCOUNTER — Ambulatory Visit: Payer: Self-pay | Admitting: *Deleted

## 2024-07-13 VITALS — BP 105/60 | Wt 127.8 lb

## 2024-07-13 DIAGNOSIS — Z1239 Encounter for other screening for malignant neoplasm of breast: Secondary | ICD-10-CM

## 2024-07-13 DIAGNOSIS — M79622 Pain in left upper arm: Secondary | ICD-10-CM

## 2024-07-13 DIAGNOSIS — N644 Mastodynia: Secondary | ICD-10-CM

## 2024-07-13 DIAGNOSIS — Z1211 Encounter for screening for malignant neoplasm of colon: Secondary | ICD-10-CM

## 2024-07-13 NOTE — Progress Notes (Signed)
 Tabitha [name] is a 43 y.o. female who presents to [facility] clinic today with complaint of left outer breast pain x 4 months that comes and goes. Patient rates the pain at a 7 out of 10.    Pap Smear: Pap smear not completed today. Last Pap smear was 04/09/2023 at [facility] clinic and was normal. Per patient has no history of an abnormal Pap smear. Last Pap smear result is available in Epic.    Physical exam: Breasts Breasts symmetrical. No skin abnormalities bilateral breasts. No nipple retraction bilateral breasts. No nipple discharge bilateral breasts. No lymphadenopathy. No lumps palpated bilateral breasts. Complaints of left outer breast tenderness on exam that was noted on previous exam 01/24/2022.  MS 3D DIAG MAMMO BILAT BR (aka MM) Result Date: 07/13/2024 CLINICAL DATA:  Patient presents with focal waxing waning left lateral breast pain. EXAM: DIGITAL DIAGNOSTIC BILATERAL MAMMOGRAM WITH TOMOSYNTHESIS AND CAD; ULTRASOUND LEFT BREAST LIMITED TECHNIQUE: Bilateral digital diagnostic mammography and breast tomosynthesis was performed. The images were evaluated with computer-aided detection. ; Targeted ultrasound examination of the left breast was performed. COMPARISON:  Previous exam(s). ACR Breast Density Category c: The breasts are heterogeneously dense, which may obscure small masses. FINDINGS: There are no masses, areas of significant asymmetry, areas of architectural distortion or suspicious calcifications. No mammographic change. On physical exam, no mass is palpated in the upper outer to lateral left breast in the area of focal pain. Targeted ultrasound is performed, showing normal fibroglandular tissue in the left breast in the area of focal pain, which is centered at 2 o'clock, 6 cm the nipple. No mass or suspicious finding. IMPRESSION: 1. No evidence of breast malignancy. RECOMMENDATION: Screening mammogram in one year.(Code:SM-B-01Y) I have discussed the findings and recommendations  with the patient. If applicable, a reminder letter will be sent to the patient regarding the next appointment. BI-RADS CATEGORY  1: Negative. Electronically Signed   By: Alm Parkins M.D.   On: 07/13/2024 15:52   MS DIGITAL SCREENING TOMO BILATERAL Result Date: 01/25/2022 CLINICAL DATA:  Screening. EXAM: DIGITAL SCREENING BILATERAL MAMMOGRAM WITH TOMOSYNTHESIS AND CAD TECHNIQUE: Bilateral screening digital craniocaudal and mediolateral oblique mammograms were obtained. Bilateral screening digital breast tomosynthesis was performed. The images were evaluated with computer-aided detection. COMPARISON:  Previous exam(s). ACR Breast Density Category c: The breast tissue is heterogeneously dense, which may obscure small masses. FINDINGS: There are no findings suspicious for malignancy. IMPRESSION: No mammographic evidence of malignancy. A result letter of this screening mammogram will be mailed directly to the patient. RECOMMENDATION: Screening mammogram in one year. (Code:SM-B-01Y) BI-RADS CATEGORY  1: Negative. Electronically Signed   By: Debby Satterfield M.D.   On: 01/25/2022 09:05   MS DIGITAL DIAG TOMO BILAT Result Date: 12/12/2020 CLINICAL DATA:  Patient for delayed follow-up of probably benign left breast mass. EXAM: DIGITAL DIAGNOSTIC BILATERAL MAMMOGRAM WITH CAD AND TOMO ULTRASOUND LEFT BREAST COMPARISON:  Previous exam(s). ACR Breast Density Category c: The breast tissue is heterogeneously dense, which may obscure small masses. FINDINGS: No concerning masses, calcifications or distortion identified within either breast. Mammographic images were processed with CAD. Targeted ultrasound is performed, showing a stable 7 x 5 x 8 mm oval circumscribed hypoechoic mass left breast 2 o'clock position 1 cm from the nipple, previously 8 x 5 x 10 mm. IMPRESSION: Stable left breast mass compatible with benign process given stability over time. No mammographic evidence for malignancy. RECOMMENDATION: Screening  mammogram in one year.(Code:SM-B-01Y) I have discussed the findings and recommendations with  the patient. If applicable, a reminder letter will be sent to the patient regarding the next appointment. BI-RADS CATEGORY  2: Benign. Electronically Signed   By: Bard Moats M.D.   On: 12/12/2020 11:49  .       Pelvic/Bimanual Pap is not indicated today per [facility] guidelines. Pap is not indicated today    Smoking History: Patient is a former smoker that quit 2 years ago.    Patient Navigation: Patient education provided. Access to services provided for patient through [place] program. Spanish interpreter Bernice Angry from [facility] provided.   Breast and Cervical Cancer Risk Assessment: Patient does not have family history of breast cancer, known genetic mutations, or radiation treatment to the chest before age 8. Patient does not have history of cervical dysplasia, immunocompromised, or DES exposure in-utero.  Risk Scores as of Encounter on 07/13/2024     Alisa           5-year 0.99%   Lifetime 13.24%   This patient is [place] but has no documented birth country, so the [employer] model used data from [place] patients to calculate their risk score. Document a birth country in the Demographics activity for a more accurate score.         Last calculated by Logan Lyle BRAVO, CMA on 07/13/2024 at  1:49 PM        A: [facility] exam without pap smear Complaint of left outer breast pain.  P: Referred patient to the [facility] for a diagnostic mammogram. Appointment scheduled Tuesday, July 13, 2024 at 1450.  Driscilla Wanda SQUIBB, RN 07/13/2024 2:03 PM

## 2024-07-13 NOTE — Patient Instructions (Signed)
 Explained breast self awareness with Tabitha [name]. Patient did not need a Pap smear today due to last Pap smear was 04/09/2023. Let her know [facility] will cover Pap smears every 3 years unless has a history of abnormal Pap smears. Referred patient to the [facility] for a diagnostic mammogram. Appointment scheduled Tuesday, July 13, 2024 at 1450. Patient aware of appointment and will be there. [name] verbalized understanding.  Noriko Macari, Wanda Ship, RN 2:03 PM

## 2024-07-27 LAB — FECAL OCCULT BLOOD, IMMUNOCHEMICAL: Fecal Occult Bld: NEGATIVE

## 2024-07-28 ENCOUNTER — Ambulatory Visit: Payer: Self-pay

## 2024-09-27 ENCOUNTER — Encounter: Payer: Self-pay | Admitting: Internal Medicine

## 2025-03-10 ENCOUNTER — Encounter: Payer: Self-pay | Admitting: Internal Medicine
